# Patient Record
Sex: Male | Born: 1965 | Race: White | Hispanic: No | Marital: Married | Smoking: Never smoker
Health system: Southern US, Community
[De-identification: ages and names within clinical notes are randomized; demographics above are authoritative.]

## PROBLEM LIST (undated history)

## (undated) DIAGNOSIS — I10 Essential (primary) hypertension: Secondary | ICD-10-CM

## (undated) DIAGNOSIS — E7800 Pure hypercholesterolemia, unspecified: Secondary | ICD-10-CM

## (undated) DIAGNOSIS — R51 Headache: Secondary | ICD-10-CM

## (undated) DIAGNOSIS — F419 Anxiety disorder, unspecified: Secondary | ICD-10-CM

## (undated) DIAGNOSIS — R072 Precordial pain: Secondary | ICD-10-CM

## (undated) DIAGNOSIS — E349 Endocrine disorder, unspecified: Secondary | ICD-10-CM

## (undated) DIAGNOSIS — G4730 Sleep apnea, unspecified: Secondary | ICD-10-CM

## (undated) DIAGNOSIS — K219 Gastro-esophageal reflux disease without esophagitis: Secondary | ICD-10-CM

## (undated) DIAGNOSIS — I2510 Atherosclerotic heart disease of native coronary artery without angina pectoris: Secondary | ICD-10-CM

## (undated) DIAGNOSIS — F329 Major depressive disorder, single episode, unspecified: Secondary | ICD-10-CM

## (undated) HISTORY — PX: UPPER GASTROINTESTINAL ENDOSCOPY: SHX188

## (undated) HISTORY — DX: Endocrine disorder, unspecified: E34.9

## (undated) HISTORY — DX: Essential (primary) hypertension: I10

## (undated) HISTORY — DX: Gastro-esophageal reflux disease without esophagitis: K21.9

## (undated) HISTORY — DX: Precordial pain: R07.2

## (undated) HISTORY — DX: Anxiety disorder, unspecified: F41.9

## (undated) HISTORY — PX: CARDIAC CATHETERIZATION: SHX172

## (undated) HISTORY — DX: Pure hypercholesterolemia, unspecified: E78.00

## (undated) HISTORY — DX: Major depressive disorder, single episode, unspecified: F32.9

## (undated) HISTORY — DX: Sleep apnea, unspecified: G47.30

## (undated) HISTORY — DX: Headache: R51

---

## 2002-07-24 ENCOUNTER — Inpatient Hospital Stay (HOSPITAL_COMMUNITY): Admission: EM | Admit: 2002-07-24 | Discharge: 2002-07-28 | Payer: Self-pay | Admitting: Psychiatry

## 2016-11-23 HISTORY — PX: COLONOSCOPY: SHX174

## 2018-03-18 ENCOUNTER — Ambulatory Visit (INDEPENDENT_AMBULATORY_CARE_PROVIDER_SITE_OTHER): Payer: PRIVATE HEALTH INSURANCE | Admitting: Psychiatry

## 2018-03-18 DIAGNOSIS — F411 Generalized anxiety disorder: Secondary | ICD-10-CM | POA: Diagnosis not present

## 2018-03-18 MED ORDER — VORTIOXETINE HBR 5 MG PO TABS: 20.0000 mg | ORAL_TABLET | Freq: Every day | ORAL | 1 refills | Status: DC

## 2018-03-18 MED ORDER — ALPRAZOLAM 0.25 MG PO TABS: 0.5000 mg | ORAL_TABLET | Freq: Every day | ORAL | 0 refills | Status: DC | PRN

## 2018-03-18 MED ORDER — GABAPENTIN 100 MG PO CAPS: 100.0000 mg | ORAL_CAPSULE | Freq: Three times a day (TID) | ORAL | 1 refills | Status: DC

## 2018-03-18 NOTE — Progress Notes (Signed)
[facility]  Patient ID: Aaron Reese,  MRN: [id number]  PCP: Angelina Sheriff, MD  Date of Evaluation: 03/18/2018 Time spent:20 minutes   HISTORY/CURRENT STATUS: HPI patient is a 52 year old white male seen last 02/18/2018 diagnosis generalized anxiety disorder with panic attacks at the last visit panic attacks were slightly better therefore increased his BuSpar and continues Trintellix.  Here with wife Aaron Reese Currently patient feels depression is not bad.  Patient is anxious.  Patient is inpatient.  Individual Medical History/ Review of Systems: Changes? :No  Allergies: Patient has no known allergies.  Current Medications:  Current Outpatient Medications:  .  ALPRAZolam (XANAX) 0.25 MG tablet, Take 0.25 mg by mouth 2 (two) times daily., Disp: , Rfl:  .  busPIRone (BUSPAR) 15 MG tablet, Take 15 mg by mouth 2 (two) times daily. 1/2 BID X 1 WK, THEN BID, Disp: , Rfl:  .  vortioxetine HBr (TRINTELLIX) 20 MG TABS tablet, Take 20 mg by mouth daily., Disp: , Rfl:  Medication Side Effects: None  Family Medical/ Social History: Changes? No  MENTAL HEALTH EXAM:  There were no vitals taken for this visit.There is no height or weight on file to calculate BMI.  General Appearance: Casual  Eye Contact:  Good  Speech:  Clear and Coherent  Volume:  Normal  Mood:  Anxious  Affect:  Appropriate  Thought Process:  Coherent  Orientation:  Full (Time, Place, and Person)  Thought Content: Logical   Suicidal Thoughts:  No  Homicidal Thoughts:  No  Memory:  Immediate  Judgement:  Good  Insight:  Good  Psychomotor Activity:  Normal  Concentration:  Concentration: Good  Recall:  Good  Fund of Knowledge: Good  Language: Good  Akathisia:  NA  AIMS (if indicated): na   Assets:  Social Support  ADL's:  Intact  Cognition: WNL  Prognosis:  Good    DIAGNOSES: No diagnosis found.  RECOMMENDATIONS: Gabapentin-order PSG 100 mg 1 3 times a day. Consider a snap  sleep Consider PSG.   Comer Locket, PA-C

## 2018-03-19 ENCOUNTER — Telehealth: Payer: Self-pay | Admitting: Psychiatry

## 2018-03-19 NOTE — Telephone Encounter (Signed)
[facility] need clarification on med Trintellix. [phone]

## 2018-03-19 NOTE — Telephone Encounter (Signed)
Clarified with [facility] patient is taking Trintellix 20mg / daily

## 2018-03-24 DIAGNOSIS — F32A Depression, unspecified: Secondary | ICD-10-CM

## 2018-03-24 DIAGNOSIS — F329 Major depressive disorder, single episode, unspecified: Secondary | ICD-10-CM | POA: Insufficient documentation

## 2018-03-24 DIAGNOSIS — I10 Essential (primary) hypertension: Secondary | ICD-10-CM

## 2018-03-24 DIAGNOSIS — R51 Headache: Secondary | ICD-10-CM

## 2018-03-24 DIAGNOSIS — E349 Endocrine disorder, unspecified: Secondary | ICD-10-CM

## 2018-03-24 DIAGNOSIS — R079 Chest pain, unspecified: Secondary | ICD-10-CM

## 2018-03-24 DIAGNOSIS — R519 Headache, unspecified: Secondary | ICD-10-CM

## 2018-03-24 DIAGNOSIS — K219 Gastro-esophageal reflux disease without esophagitis: Secondary | ICD-10-CM

## 2018-03-24 DIAGNOSIS — E7800 Pure hypercholesterolemia, unspecified: Secondary | ICD-10-CM

## 2018-03-24 DIAGNOSIS — F419 Anxiety disorder, unspecified: Secondary | ICD-10-CM

## 2018-03-24 DIAGNOSIS — R072 Precordial pain: Secondary | ICD-10-CM

## 2018-03-24 HISTORY — DX: Precordial pain: R07.2

## 2018-03-24 HISTORY — DX: Headache, unspecified: R51.9

## 2018-03-24 HISTORY — DX: Gastro-esophageal reflux disease without esophagitis: K21.9

## 2018-03-24 HISTORY — DX: Essential (primary) hypertension: I10

## 2018-03-24 HISTORY — DX: Endocrine disorder, unspecified: E34.9

## 2018-03-24 HISTORY — DX: Depression, unspecified: F32.A

## 2018-03-24 HISTORY — DX: Anxiety disorder, unspecified: F41.9

## 2018-03-24 HISTORY — DX: Pure hypercholesterolemia, unspecified: E78.00

## 2018-03-24 HISTORY — DX: Chest pain, unspecified: R07.9

## 2018-03-31 ENCOUNTER — Encounter: Payer: Self-pay | Admitting: *Deleted

## 2018-03-31 ENCOUNTER — Telehealth: Payer: Self-pay | Admitting: *Deleted

## 2018-03-31 ENCOUNTER — Encounter: Payer: Self-pay | Admitting: Cardiology

## 2018-03-31 ENCOUNTER — Ambulatory Visit (INDEPENDENT_AMBULATORY_CARE_PROVIDER_SITE_OTHER): Payer: PRIVATE HEALTH INSURANCE | Admitting: Cardiology

## 2018-03-31 ENCOUNTER — Ambulatory Visit (HOSPITAL_BASED_OUTPATIENT_CLINIC_OR_DEPARTMENT_OTHER)
Admission: RE | Admit: 2018-03-31 | Discharge: 2018-03-31 | Disposition: A | Discharge status: Home / Self Care | Payer: PRIVATE HEALTH INSURANCE | Source: Ambulatory Visit | Attending: Cardiology | Admitting: Cardiology

## 2018-03-31 VITALS — BP 130/96 | HR 101 | Ht 72.0 in | Wt 239.8 lb

## 2018-03-31 DIAGNOSIS — J9811 Atelectasis: Secondary | ICD-10-CM

## 2018-03-31 DIAGNOSIS — I10 Essential (primary) hypertension: Secondary | ICD-10-CM

## 2018-03-31 DIAGNOSIS — E7800 Pure hypercholesterolemia, unspecified: Secondary | ICD-10-CM

## 2018-03-31 DIAGNOSIS — R079 Chest pain, unspecified: Secondary | ICD-10-CM

## 2018-03-31 DIAGNOSIS — R918 Other nonspecific abnormal finding of lung field: Secondary | ICD-10-CM

## 2018-03-31 DIAGNOSIS — I2510 Atherosclerotic heart disease of native coronary artery without angina pectoris: Secondary | ICD-10-CM

## 2018-03-31 HISTORY — DX: Other nonspecific abnormal finding of lung field: R91.8

## 2018-03-31 LAB — BASIC METABOLIC PANEL
BUN/Creatinine Ratio: 10 (ref 9–20)
BUN: 12 mg/dL (ref 6–24)
CALCIUM: 9.6 mg/dL (ref 8.7–10.2)
CHLORIDE: 100 mmol/L (ref 96–106)
CO2: 25 mmol/L (ref 20–29)
Creatinine, Ser: 1.2 mg/dL (ref 0.76–1.27)
GFR calc non Af Amer: 70 mL/min/{1.73_m2} (ref >59)
GFR, EST AFRICAN AMERICAN: 81 mL/min/{1.73_m2} (ref >59)
GLUCOSE: 93 mg/dL (ref 65–99)
POTASSIUM: 4.6 mmol/L (ref 3.5–5.2)
SODIUM: 140 mmol/L (ref 134–144)

## 2018-03-31 LAB — CBC
Hematocrit: 51 % (ref 37.5–51.0)
Hemoglobin: 17.9 g/dL — ABNORMAL HIGH (ref 13.0–17.7)
MCH: 30.9 pg (ref 26.6–33.0)
MCHC: 35.1 g/dL (ref 31.5–35.7)
MCV: 88 fL (ref 79–97)
PLATELETS: 229 10*3/uL (ref 150–450)
RBC: 5.79 x10E6/uL (ref 4.14–5.80)
RDW: 13.8 % (ref 12.3–15.4)
WBC: 12.8 10*3/uL — AB (ref 3.4–10.8)

## 2018-03-31 LAB — TROPONIN I: Troponin I: <0.01 ng/mL (ref 0.00–0.04)

## 2018-03-31 MED ORDER — ASPIRIN EC 81 MG PO TBEC: 81.0000 mg | DELAYED_RELEASE_TABLET | Freq: Every day | ORAL | 3 refills | Status: DC

## 2018-03-31 MED ORDER — METOPROLOL SUCCINATE ER 50 MG PO TB24: 50.0000 mg | ORAL_TABLET | Freq: Every day | ORAL | 3 refills | Status: DC

## 2018-03-31 NOTE — H&P (View-Only) (Signed)
Cardiology Office Note:    Date:  03/31/2018   ID:  Aaron Reese, DOB Jun 18, 1966, MRN [id number]  PCP:  Angelina Sheriff, MD  Cardiologist:  Shirlee More, MD   Referring MD: No ref. provider found  ASSESSMENT:    1. Chest pain in adult   2. Benign essential hypertension   3. Hypercholesterolemia   4. Mild CAD    PLAN:    In order of problems listed above:  1. He underwent coronary angiography in 2010 and had mild nonobstructive CAD.  He has developed a pattern of anginal discomfort presently [place] angina classification 3 he has had episodes awakening from sleep at night.  For further evaluation I asked him to undergo coronary angiography we will check a troponin if positive he need to be admitted to treat his acute coronary syndrome in the interim we will start aspirin and beta-blocker and continue his statin.  Risk benefits and options detailed with the patient and his wife for informed consent 2. Stable continue current treatment 3. Stable continue with statin 4. Clinically my suspicion is developed severe obstructive CAD over the last decade is very high I think there is no utility for noninvasive testing at this time we will check troponin exclude acute coronary syndrome referral for coronary angiography  Next appointment 2 weeks   Medication Adjustments/Labs and Tests Ordered: Current medicines are reviewed at length with the patient today.  Concerns regarding medicines are outlined above.  Orders Placed This Encounter  Procedures  . DG Chest 2 View  . CBC  . Basic Metabolic Panel (BMET)  . Troponin I  . EKG 12-Lead   Meds ordered this encounter  Medications  . metoprolol succinate (TOPROL XL) 50 MG 24 hr tablet    Sig: Take 1 tablet (50 mg total) by mouth daily. Take with or immediately following a meal.    Dispense:  30 tablet    Refill:  3  . aspirin EC 81 MG tablet    Sig: Take 1 tablet (81 mg total) by mouth daily.    Dispense:  90 tablet    Refill:  3      Chief Complaint  Patient presents with  . Chest Pain    History of Present Illness:    Aaron Reese is a 52 y.o. male who is being seen today for the evaluation of chest pain at the request of Dr. Reading He was seen by me in 2010 as an inpatient [facility] referred for coronary angiography for chest pain and have mild nonobstructive CAD.  For the last several months he has been having chest pain initially was quite vague nonexertional atypical in nature but his symptoms have progressed since he had pneumonia 10 weeks ago and now complains of very typical angina pectoris.  He describes substernal tightness and burning which is moderate in severity worsened with activity and improved with rest but now is happening at rest and has awakened him from his sleep at night and has increased in frequency.  He had an episode walking in from the car to the office today.  His previous EKGs and EKG today remains normal.  Reviewed the option of referral for noninvasive testing or cardiac CTA but with known CAD and rapid progression of symptoms [place] angina classification 3 I advised he undergo coronary angiography and initiate medical therapy with aspirin and beta-blocker.  His chest pain does not radiate is not pleuritic no associated nausea not worsened with swallowing no associated diaphoresis  Past Medical History:  Diagnosis Date  . Anxiety 03/24/2018  . Benign essential hypertension 03/24/2018  . Depression 03/24/2018  . Frequent headaches 03/24/2018  . GERD (gastroesophageal reflux disease) 03/24/2018  . Hypercholesterolemia 03/24/2018  . Hypotestosteronemia 03/24/2018  . Substernal chest pain 03/24/2018    Past Surgical History:  Procedure Laterality Date  . CARDIAC CATHETERIZATION      Current Medications: Current Meds  Medication Sig  . ALPRAZolam (XANAX) 0.25 MG tablet Take 2 tablets (0.5 mg total) by mouth daily as needed for anxiety.  Marland Kitchen levocetirizine (XYZAL) 5 MG tablet  Take 1 tablet (5 mg) by mouth daily in the evening  . lisinopril (PRINIVIL,ZESTRIL) 10 MG tablet Take 10 mg by mouth daily.  . pantoprazole (PROTONIX) 40 MG tablet Take 40 mg by mouth daily.  . simvastatin (ZOCOR) 40 MG tablet Take 40 mg by mouth daily.  Marland Kitchen vortioxetine HBr (TRINTELLIX) 20 MG TABS tablet Take 20 mg by mouth daily.     Allergies:   Citalopram; Sertraline; and Venlafaxine   Social History   Socioeconomic History  . Marital status: Single    Spouse name: Not on file  . Number of children: Not on file  . Years of education: Not on file  . Highest education level: Not on file  Occupational History  . Not on file  Social Needs  . Financial resource strain: Not on file  . Food insecurity:    Worry: Not on file    Inability: Not on file  . Transportation needs:    Medical: Not on file    Non-medical: Not on file  Tobacco Use  . Smoking status: Never Smoker  . Smokeless tobacco: Never Used  Substance and Sexual Activity  . Alcohol use: Not Currently    Frequency: Never  . Drug use: Not Currently  . Sexual activity: Not on file  Lifestyle  . Physical activity:    Days per week: Not on file    Minutes per session: Not on file  . Stress: Not on file  Relationships  . Social connections:    Talks on phone: Not on file    Gets together: Not on file    Attends religious service: Not on file    Active member of club or organization: Not on file    Attends meetings of clubs or organizations: Not on file    Relationship status: Not on file  Other Topics Concern  . Not on file  Social History Narrative  . Not on file     Family History: The patient's family history includes ALS in his father; Hypertension in his mother; Stroke in his brother. There is no history of Heart disease, Cancer, or Diabetes.  ROS:   Review of Systems  Constitution: Positive for chills and malaise/fatigue.  HENT: Negative.   Eyes: Negative.   Cardiovascular: Positive for chest pain  and dyspnea on exertion.  Respiratory: Positive for cough and shortness of breath (with exertion).   Endocrine: Negative.   Hematologic/Lymphatic: Negative.   Skin: Negative.   Musculoskeletal: Negative.   Gastrointestinal: Positive for heartburn.  Genitourinary: Negative.   Neurological: Negative.   Psychiatric/Behavioral: Positive for depression. The patient is nervous/anxious.   Allergic/Immunologic: Negative.    Please see the history of present illness.     All other systems reviewed and are negative.  EKGs/Labs/Other Studies Reviewed:    The following studies were reviewed today:   EKG:  EKG is  ordered today.  The ekg ordered today  demonstrates sinus rhythm and normal  Recent Labs: No results found for requested labs within last 8760 hours.  Recent Lipid Panel No results found for: CHOL, TRIG, HDL, CHOLHDL, VLDL, LDLCALC, LDLDIRECT  Physical Exam:    VS:  BP (!) 130/96 (BP Location: Left Arm, Patient Position: Sitting, Cuff Size: Large)   Pulse (!) 101   Ht 6' (1.829 m)   Wt 239 lb 12.8 oz (108.8 kg)   SpO2 96%   BMI 32.52 kg/m     Wt Readings from Last 3 Encounters:  03/31/18 239 lb 12.8 oz (108.8 kg)     GEN:  Well nourished, well developed in no acute distress HEENT: Normal NECK: No JVD; No carotid bruits LYMPHATICS: No lymphadenopathy CARDIAC: RRR, no murmurs, rubs, gallops RESPIRATORY:  Clear to auscultation without rales, wheezing or rhonchi  ABDOMEN: Soft, non-tender, non-distended MUSCULOSKELETAL:  No edema; No deformity  SKIN: Warm and dry NEUROLOGIC:  Alert and oriented x 3 PSYCHIATRIC:  Normal affect     Signed, Shirlee More, MD  03/31/2018 12:09 PM    [place]

## 2018-03-31 NOTE — Telephone Encounter (Signed)
-----   Message from Richardo Priest, MD sent at 03/31/2018  3:48 PM EDT ----- Normal or stable result  Non contrast CT chest

## 2018-03-31 NOTE — Addendum Note (Signed)
Addended by: Austin Miles on: 03/31/2018 04:21 PM   Modules accepted: Orders

## 2018-03-31 NOTE — Progress Notes (Signed)
Cardiology Office Note:    Date:  03/31/2018   ID:  Aaron Reese, DOB [birth year], MRN [id number]  PCP:  Angelina Sheriff, MD  Cardiologist:  Shirlee More, MD   Referring MD: No ref. provider found  ASSESSMENT:    1. Chest pain in adult   2. Benign essential hypertension   3. Hypercholesterolemia   4. Mild CAD    PLAN:    In order of problems listed above:  1. He underwent coronary angiography in 2010 and had mild nonobstructive CAD.  He has developed a pattern of anginal discomfort presently [place] angina classification 3 he has had episodes awakening from sleep at night.  For further evaluation I asked him to undergo coronary angiography we will check a troponin if positive he need to be admitted to treat his acute coronary syndrome in the interim we will start aspirin and beta-blocker and continue his statin.  Risk benefits and options detailed with the patient and his wife for informed consent 2. Stable continue current treatment 3. Stable continue with statin 4. Clinically my suspicion is developed severe obstructive CAD over the last decade is very high I think there is no utility for noninvasive testing at this time we will check troponin exclude acute coronary syndrome referral for coronary angiography  Next appointment 2 weeks   Medication Adjustments/Labs and Tests Ordered: Current medicines are reviewed at length with the patient today.  Concerns regarding medicines are outlined above.  Orders Placed This Encounter  Procedures  . DG Chest 2 View  . CBC  . Basic Metabolic Panel (BMET)  . Troponin I  . EKG 12-Lead   Meds ordered this encounter  Medications  . metoprolol succinate (TOPROL XL) 50 MG 24 hr tablet    Sig: Take 1 tablet (50 mg total) by mouth daily. Take with or immediately following a meal.    Dispense:  30 tablet    Refill:  3  . aspirin EC 81 MG tablet    Sig: Take 1 tablet (81 mg total) by mouth daily.    Dispense:  90 tablet    Refill:  3      Chief Complaint  Patient presents with  . Chest Pain    History of Present Illness:    Aaron Reese is a 52 y.o. male who is being seen today for the evaluation of chest pain at the request of Dr. Reading He was seen by me in 2010 as an inpatient [facility] referred for coronary angiography for chest pain and have mild nonobstructive CAD.  For the last several months he has been having chest pain initially was quite vague nonexertional atypical in nature but his symptoms have progressed since he had pneumonia 10 weeks ago and now complains of very typical angina pectoris.  He describes substernal tightness and burning which is moderate in severity worsened with activity and improved with rest but now is happening at rest and has awakened him from his sleep at night and has increased in frequency.  He had an episode walking in from the car to the office today.  His previous EKGs and EKG today remains normal.  Reviewed the option of referral for noninvasive testing or cardiac CTA but with known CAD and rapid progression of symptoms [place] angina classification 3 I advised he undergo coronary angiography and initiate medical therapy with aspirin and beta-blocker.  His chest pain does not radiate is not pleuritic no associated nausea not worsened with swallowing no associated diaphoresis  Past Medical History:  Diagnosis Date  . Anxiety 03/24/2018  . Benign essential hypertension 03/24/2018  . Depression 03/24/2018  . Frequent headaches 03/24/2018  . GERD (gastroesophageal reflux disease) 03/24/2018  . Hypercholesterolemia 03/24/2018  . Hypotestosteronemia 03/24/2018  . Substernal chest pain 03/24/2018    Past Surgical History:  Procedure Laterality Date  . CARDIAC CATHETERIZATION      Current Medications: Current Meds  Medication Sig  . ALPRAZolam (XANAX) 0.25 MG tablet Take 2 tablets (0.5 mg total) by mouth daily as needed for anxiety.  Marland Kitchen levocetirizine (XYZAL) 5 MG tablet  Take 1 tablet (5 mg) by mouth daily in the evening  . lisinopril (PRINIVIL,ZESTRIL) 10 MG tablet Take 10 mg by mouth daily.  . pantoprazole (PROTONIX) 40 MG tablet Take 40 mg by mouth daily.  . simvastatin (ZOCOR) 40 MG tablet Take 40 mg by mouth daily.  Marland Kitchen vortioxetine HBr (TRINTELLIX) 20 MG TABS tablet Take 20 mg by mouth daily.     Allergies:   Citalopram; Sertraline; and Venlafaxine   Social History   Socioeconomic History  . Marital status: Single    Spouse name: Not on file  . Number of children: Not on file  . Years of education: Not on file  . Highest education level: Not on file  Occupational History  . Not on file  Social Needs  . Financial resource strain: Not on file  . Food insecurity:    Worry: Not on file    Inability: Not on file  . Transportation needs:    Medical: Not on file    Non-medical: Not on file  Tobacco Use  . Smoking status: Never Smoker  . Smokeless tobacco: Never Used  Substance and Sexual Activity  . Alcohol use: Not Currently    Frequency: Never  . Drug use: Not Currently  . Sexual activity: Not on file  Lifestyle  . Physical activity:    Days per week: Not on file    Minutes per session: Not on file  . Stress: Not on file  Relationships  . Social connections:    Talks on phone: Not on file    Gets together: Not on file    Attends religious service: Not on file    Active member of club or organization: Not on file    Attends meetings of clubs or organizations: Not on file    Relationship status: Not on file  Other Topics Concern  . Not on file  Social History Narrative  . Not on file     Family History: The patient's family history includes ALS in his father; Hypertension in his mother; Stroke in his brother. There is no history of Heart disease, Cancer, or Diabetes.  ROS:   Review of Systems  Constitution: Positive for chills and malaise/fatigue.  HENT: Negative.   Eyes: Negative.   Cardiovascular: Positive for chest pain  and dyspnea on exertion.  Respiratory: Positive for cough and shortness of breath (with exertion).   Endocrine: Negative.   Hematologic/Lymphatic: Negative.   Skin: Negative.   Musculoskeletal: Negative.   Gastrointestinal: Positive for heartburn.  Genitourinary: Negative.   Neurological: Negative.   Psychiatric/Behavioral: Positive for depression. The patient is nervous/anxious.   Allergic/Immunologic: Negative.    Please see the history of present illness.     All other systems reviewed and are negative.  EKGs/Labs/Other Studies Reviewed:    The following studies were reviewed today:   EKG:  EKG is  ordered today.  The ekg ordered today  demonstrates sinus rhythm and normal  Recent Labs: No results found for requested labs within last 8760 hours.  Recent Lipid Panel No results found for: CHOL, TRIG, HDL, CHOLHDL, VLDL, LDLCALC, LDLDIRECT  Physical Exam:    VS:  BP (!) 130/96 (BP Location: Left Arm, Patient Position: Sitting, Cuff Size: Large)   Pulse (!) 101   Ht 6' (1.829 m)   Wt 239 lb 12.8 oz (108.8 kg)   SpO2 96%   BMI 32.52 kg/m     Wt Readings from Last 3 Encounters:  03/31/18 239 lb 12.8 oz (108.8 kg)     GEN:  Well nourished, well developed in no acute distress HEENT: Normal NECK: No JVD; No carotid bruits LYMPHATICS: No lymphadenopathy CARDIAC: RRR, no murmurs, rubs, gallops RESPIRATORY:  Clear to auscultation without rales, wheezing or rhonchi  ABDOMEN: Soft, non-tender, non-distended MUSCULOSKELETAL:  No edema; No deformity  SKIN: Warm and dry NEUROLOGIC:  Alert and oriented x 3 PSYCHIATRIC:  Normal affect     Signed, Shirlee More, MD  03/31/2018 12:09 PM    [place]

## 2018-03-31 NOTE — Telephone Encounter (Signed)
Patient informed of chest x-ray and lab results from this morning and advised that Dr. Bettina Gavia recommends a chest CT without contrast. Patient verbalized understanding and is agreeable. Advised patient Aaron Reese from our imaging department would contact him to schedule for tomorrow. Precert sent. No further questions.

## 2018-03-31 NOTE — Patient Instructions (Addendum)
Medication Instructions:  Your physician has recommended you make the following change in your medication:   START aspirin 81 mg daily START metoprolol succinate (toprol XL) 50 mg daily   If you need a refill on your cardiac medications before your next appointment, please call your pharmacy.   Lab work: Your physician recommends that you return for lab work today: STAT troponin, CBC, BMP.   If you have labs (blood work) drawn today and your tests are completely normal, you will receive your results only by: Marland Kitchen MyChart Message (if you have MyChart) OR . A paper copy in the mail If you have any lab test that is abnormal or we need to change your treatment, we will call you to review the results.  Testing/Procedures: You had an EKG today.   A chest x-ray takes a picture of the organs and structures inside the chest, including the heart, lungs, and blood vessels. This test can show several things, including, whether the heart is enlarges; whether fluid is building up in the lungs; and whether pacemaker / defibrillator leads are still in place.  [facility] [address], [place] [address] Dept: [phone] Loc: [phone]  [id number]  03/31/2018  You are scheduled for a Cardiac Catheterization on Wednesday, October 16 with Dr. Shelva Majestic.  1. Please arrive at the [facility] (Main Entrance A) at [facility]: [address] [place], [place] [address] at 9:30 AM (This time is two hours before your procedure to ensure your preparation). Free valet parking service is available.   Special note: Every effort is made to have your procedure done on time. Please understand that emergencies sometimes delay scheduled procedures.  2. Diet: Do not eat solid foods after midnight.  The patient may have clear liquids until 5am upon the day of the procedure.  3. Labs: None  needed.  4. Medication instructions in preparation for your procedure:   Contrast Allergy: No   Stop taking, Lisinopril (Zestril or Prinivil) on Tuesday, October 15.   On the morning of your procedure, take your Aspirin and any morning medicines NOT listed above.  You may use sips of water.  5. Plan for one night stay--bring personal belongings. 6. Bring a current list of your medications and current insurance cards. 7. You MUST have a responsible person to drive you home. 8. Someone MUST be with you the first 24 hours after you arrive home or your discharge will be delayed. 9. Please wear clothes that are easy to get on and off and wear slip-on shoes.  Thank you for allowing [place] to care for you!   -- [facility]   Follow-Up: [facility] you and your health needs are our priority.  As part of our continuing mission to provide you with exceptional heart care, we have created designated Provider Care Teams.  These Care Teams include your primary Cardiologist (physician) and Advanced Practice Providers (APPs -  Physician Assistants and Nurse Practitioners) who all work together to provide you with the care you need, when you need it. . You will need a follow up appointment in 2 weeks.       Aspirin and Your Heart Aspirin is a medicine that affects the way blood clots. Aspirin can be used to help reduce the risk of blood clots, heart attacks, and other heart-related problems. Should I take aspirin? Your health  care provider will help you determine whether it is safe and beneficial for you to take aspirin daily. Taking aspirin daily may be beneficial if you:  Have had a heart attack or chest pain.  Have undergone open heart surgery such as coronary artery bypass surgery (CABG).  Have had coronary angioplasty.  Have experienced a stroke or transient ischemic attack (TIA).  Have peripheral vascular disease (PVD).  Have chronic heart rhythm  problems such as atrial fibrillation.  Are there any risks of taking aspirin daily? Daily use of aspirin can increase your risk of side effects. Some of these include:  Bleeding. Bleeding problems can be minor or serious. An example of a minor problem is a cut that does not stop bleeding. An example of a more serious problem is stomach bleeding or bleeding into the brain. Your risk of bleeding is increased if you are also taking non-steroidal anti-inflammatory medicine (NSAIDs).  Increased bruising.  Upset stomach.  An allergic reaction. People who have nasal polyps have an increased risk of developing an aspirin allergy.  What are some guidelines I should follow when taking aspirin?  Take aspirin only as directed by your health care provider. Make sure you understand how much you should take and what form you should take. The two forms of aspirin are: ? Non-enteric-coated. This type of aspirin does not have a coating and is absorbed quickly. Non-enteric-coated aspirin is usually recommended for people with chest pain. This type of aspirin also comes in a chewable form. ? Enteric-coated. This type of aspirin has a special coating that releases the medicine very slowly. Enteric-coated aspirin causes less stomach upset than non-enteric-coated aspirin. This type of aspirin should not be chewed or crushed.  Drink alcohol in moderation. Drinking alcohol increases your risk of bleeding. When should I seek medical care?  You have unusual bleeding or bruising.  You have stomach pain.  You have an allergic reaction. Symptoms of an allergic reaction include: ? Hives. ? Itchy skin. ? Swelling of the lips, tongue, or face.  You have ringing in your ears. When should I seek immediate medical care?  Your bowel movements are bloody, dark red, or black in color.  You vomit or cough up blood.  You have blood in your urine.  You cough, wheeze, or feel short of breath. If you have any of the  following symptoms, this is an emergency. Do not wait to see if the pain will go away. Get medical help at once. Call your local emergency services (911 in the [place] Do not drive yourself to the hospital.  You have severe chest pain, especially if the pain is crushing or pressure-like and spreads to the arms, back, neck, or jaw.  You have stroke-like symptoms, such as: ? Loss of vision. ? Difficulty talking. ? Numbness or weakness on one side of your body. ? Numbness or weakness in your arm or leg. ? Not thinking clearly or feeling confused.  This information is not intended to replace advice given to you by your health care provider. Make sure you discuss any questions you have with your health care provider. Document Released: 05/17/2008 Document Revised: 10/12/2015 Document Reviewed: 09/09/2013 Elsevier Interactive Patient Education  2018 [place].   Metoprolol extended-release tablets What is this medicine? METOPROLOL (me TOE proe lole) is a beta-blocker. Beta-blockers reduce the workload on the heart and help it to beat more regularly. This medicine is used to treat high blood pressure and to prevent chest pain. It is  also used to after a heart attack and to prevent an additional heart attack from occurring. This medicine may be used for other purposes; ask your health care provider or pharmacist if you have questions. COMMON BRAND NAME(S): toprol, Toprol XL What should I tell my health care provider before I take this medicine? They need to know if you have any of these conditions: -diabetes -heart or vessel disease like slow heart rate, worsening heart failure, heart block, sick sinus syndrome or Raynaud's disease -kidney disease -liver disease -lung or breathing disease, like asthma or emphysema -pheochromocytoma -thyroid disease -an unusual or allergic reaction to metoprolol, other beta-blockers, medicines, foods, dyes, or preservatives -pregnant or trying to get  pregnant -breast-feeding How should I use this medicine? Take this medicine by mouth with a glass of water. Follow the directions on the prescription label. Do not crush or chew. Take this medicine with or immediately after meals. Take your doses at regular intervals. Do not take more medicine than directed. Do not stop taking this medicine suddenly. This could lead to serious heart-related effects. Talk to your pediatrician regarding the use of this medicine in children. While this drug may be prescribed for children as young as 6 years for selected conditions, precautions do apply. Overdosage: If you think you have taken too much of this medicine contact a poison control center or emergency room at once. NOTE: This medicine is only for you. Do not share this medicine with others. What if I miss a dose? If you miss a dose, take it as soon as you can. If it is almost time for your next dose, take only that dose. Do not take double or extra doses. What may interact with this medicine? This medicine may interact with the following medications: -certain medicines for blood pressure, heart disease, irregular heart beat -certain medicines for depression, like monoamine oxidase (MAO) inhibitors, fluoxetine, or paroxetine -clonidine -dobutamine -epinephrine -isoproterenol -reserpine This list may not describe all possible interactions. Give your health care provider a list of all the medicines, herbs, non-prescription drugs, or dietary supplements you use. Also tell them if you smoke, drink alcohol, or use illegal drugs. Some items may interact with your medicine. What should I watch for while using this medicine? Visit your doctor or health care professional for regular check ups. Contact your doctor right away if your symptoms worsen. Check your blood pressure and pulse rate regularly. Ask your health care professional what your blood pressure and pulse rate should be, and when you should contact  them. You may get drowsy or dizzy. Do not drive, use machinery, or do anything that needs mental alertness until you know how this medicine affects you. Do not sit or stand up quickly, especially if you are an older patient. This reduces the risk of dizzy or fainting spells. Contact your doctor if these symptoms continue. Alcohol may interfere with the effect of this medicine. Avoid alcoholic drinks. What side effects may I notice from receiving this medicine? Side effects that you should report to your doctor or health care professional as soon as possible: -allergic reactions like skin rash, itching or hives -cold or numb hands or feet -depression -difficulty breathing -faint -fever with sore throat -irregular heartbeat, chest pain -rapid weight gain -swollen legs or ankles Side effects that usually do not require medical attention (report to your doctor or health care professional if they continue or are bothersome): -anxiety or nervousness -change in sex drive or performance -dry skin -headache -nightmares or  trouble sleeping -short term memory loss -stomach upset or diarrhea -unusually tired This list may not describe all possible side effects. Call your doctor for medical advice about side effects. You may report side effects to FDA at 1-800-FDA-1088. Where should I keep my medicine? Keep out of the reach of children. Store at room temperature between 15 and 30 degrees C (59 and 86 degrees F). Throw away any unused medicine after the expiration date. NOTE: This sheet is a summary. It may not cover all possible information. If you have questions about this medicine, talk to your doctor, pharmacist, or health care provider.  2018 Elsevier/Gold Standard (2013-02-06 14:41:37)     Coronary Angiogram With Stent Coronary angiogram with stent placement is a procedure to widen or open a narrow blood vessel of the heart (coronary artery). Arteries may become blocked by cholesterol  buildup (plaques) in the lining of the wall. When a coronary artery becomes partially blocked, blood flow to that area decreases. This may lead to chest pain or a heart attack (myocardial infarction). A stent is a small piece of metal that looks like mesh or a spring. Stent placement may be done as treatment for a heart attack or right after a coronary angiogram in which a blocked artery is found. Let your health care provider know about:  Any allergies you have.  All medicines you are taking, including vitamins, herbs, eye drops, creams, and over-the-counter medicines.  Any problems you or family members have had with anesthetic medicines.  Any blood disorders you have.  Any surgeries you have had.  Any medical conditions you have.  Whether you are pregnant or may be pregnant. What are the risks? Generally, this is a safe procedure. However, problems may occur, including:  Damage to the heart or its blood vessels.  A return of blockage.  Bleeding, infection, or bruising at the insertion site.  A collection of blood under the skin (hematoma) at the insertion site.  A blood clot in another part of the body.  Kidney injury.  Allergic reaction to the dye or contrast that is used.  Bleeding into the abdomen (retroperitoneal bleeding).  What happens before the procedure? Staying hydrated Follow instructions from your health care provider about hydration, which may include:  Up to 2 hours before the procedure - you may continue to drink clear liquids, such as water, clear fruit juice, black coffee, and plain tea.  Eating and drinking restrictions Follow instructions from your health care provider about eating and drinking, which may include:  8 hours before the procedure - stop eating heavy meals or foods such as meat, fried foods, or fatty foods.  6 hours before the procedure - stop eating light meals or foods, such as toast or cereal.  2 hours before the procedure - stop  drinking clear liquids.  Ask your health care provider about:  Changing or stopping your regular medicines. This is especially important if you are taking diabetes medicines or blood thinners.  Taking medicines such as ibuprofen. These medicines can thin your blood. Do not take these medicines before your procedure if your health care provider instructs you not to. Generally, aspirin is recommended before a procedure of passing a small, thin tube (catheter) through a blood vessel and into the heart (cardiac catheterization).  What happens during the procedure?  An IV tube will be inserted into one of your veins.  You will be given one or more of the following: ? A medicine to help you relax (  sedative). ? A medicine to numb the area where the catheter will be inserted into an artery (local anesthetic).  To reduce your risk of infection: ? Your health care team will wash or sanitize their hands. ? Your skin will be washed with soap. ? Hair may be removed from the area where the catheter will be inserted.  Using a guide wire, the catheter will be inserted into an artery. The location may be in your groin, in your wrist, or in the fold of your arm (near your elbow).  A type of X-ray (fluoroscopy) will be used to help guide the catheter to the opening of the arteries in the heart.  A dye will be injected into the catheter, and X-rays will be taken. The dye will help to show where any narrowing or blockages are located in the arteries.  A tiny wire will be guided to the blocked spot, and a balloon will be inflated to make the artery wider.  The stent will be expanded and will crush the plaques into the wall of the vessel. The stent will hold the area open and improve the blood flow. Most stents have a drug coating to reduce the risk of the stent narrowing over time.  The artery may be made wider using a drill, laser, or other tools to remove plaques.  When the blood flow is better, the  catheter will be removed. The lining of the artery will grow over the stent, which stays where it was placed. This procedure may vary among health care providers and hospitals. What happens after the procedure?  If the procedure is done through the leg, you will be kept in bed lying flat for about 6 hours. You will be instructed to not bend and not cross your legs.  The insertion site will be checked frequently.  The pulse in your foot or wrist will be checked frequently.  You may have additional blood tests, X-rays, and a test that records the electrical activity of your heart (electrocardiogram, or ECG). This information is not intended to replace advice given to you by your health care provider. Make sure you discuss any questions you have with your health care provider. Document Released: 12/09/2002 Document Revised: 02/02/2016 Document Reviewed: 01/08/2016 Elsevier Interactive Patient Education  [employer].

## 2018-04-01 ENCOUNTER — Telehealth: Payer: Self-pay | Admitting: *Deleted

## 2018-04-01 NOTE — Telephone Encounter (Signed)
Patient called for an update regarding insurance approval for CT chest wo contrast, insurance approval still pending. CT chest wo contrast needs to be completed to rule out pneumonia before proceeding with heart catheterization per Dr. Bettina Gavia. Patient agreeable, heart cath has been cancelled until further notice. Advised patient we would contact him to schedule CT when insurance approval is received. Patient verbalized understanding. No further questions.

## 2018-04-02 ENCOUNTER — Encounter (HOSPITAL_COMMUNITY): Admission: RE | Payer: Self-pay | Source: Ambulatory Visit

## 2018-04-02 ENCOUNTER — Ambulatory Visit (HOSPITAL_COMMUNITY)
Admission: RE | Admit: 2018-04-02 | Payer: PRIVATE HEALTH INSURANCE | Source: Ambulatory Visit | Admitting: Cardiovascular Disease

## 2018-04-02 ENCOUNTER — Ambulatory Visit (HOSPITAL_BASED_OUTPATIENT_CLINIC_OR_DEPARTMENT_OTHER)
Admission: RE | Admit: 2018-04-02 | Discharge: 2018-04-02 | Disposition: A | Discharge status: Home / Self Care | Payer: PRIVATE HEALTH INSURANCE | Source: Ambulatory Visit | Attending: Cardiology | Admitting: Cardiology

## 2018-04-02 DIAGNOSIS — I700 Atherosclerosis of aorta: Secondary | ICD-10-CM | POA: Diagnosis not present

## 2018-04-02 DIAGNOSIS — J9811 Atelectasis: Secondary | ICD-10-CM | POA: Insufficient documentation

## 2018-04-02 DIAGNOSIS — I2510 Atherosclerotic heart disease of native coronary artery without angina pectoris: Secondary | ICD-10-CM | POA: Insufficient documentation

## 2018-04-02 SURGERY — LEFT HEART CATH AND CORONARY ANGIOGRAPHY
Anesthesia: LOCAL

## 2018-04-03 ENCOUNTER — Telehealth: Payer: Self-pay | Admitting: *Deleted

## 2018-04-03 DIAGNOSIS — J9811 Atelectasis: Secondary | ICD-10-CM

## 2018-04-03 NOTE — Telephone Encounter (Signed)
-----   Message from Richardo Priest, MD sent at 04/03/2018 11:39 AM EDT ----- Normal or stable result  CT shows more exetensive and worsened abnormality    Should see pulmonary in [facility]  I would proceed with heart cath first

## 2018-04-03 NOTE — Telephone Encounter (Signed)
Patient called with CT chest results and informed that Dr. Bettina Gavia recommends to proceed with heart catheterization at this time. Patient agreeable and has been rescheduled for Tuesday, 04/08/18 at 9:00 am. Patient instructed to be there at 7:00 am and reminded to hold lisinopril starting Monday, 04/07/18.   Patient agreeable to pulmonology referral which has been placed due to CT results. Informed him that someone would contact him to schedule an appointment.  Patient verbalized understanding. No further questions.

## 2018-04-05 ENCOUNTER — Encounter: Payer: Self-pay | Admitting: Emergency Medicine

## 2018-04-05 DIAGNOSIS — F411 Generalized anxiety disorder: Secondary | ICD-10-CM | POA: Insufficient documentation

## 2018-04-05 HISTORY — DX: Generalized anxiety disorder: F41.1

## 2018-04-07 ENCOUNTER — Telehealth: Payer: Self-pay | Admitting: *Deleted

## 2018-04-07 NOTE — Telephone Encounter (Signed)
Pt contacted pre-catheterization scheduled at [facility] for: Tuesday October 22,2019 9 AM Verified arrival time and place: [place] Entrance A at: 7 AM  No solid food after midnight prior to cath, clear liquids until 5 AM day of procedure. Verified allergies in Epic Verified no diabetes medications.  AM meds can be  taken pre-cath with sip of water including: ASA 81 mg   Confirmed patient has responsible person to drive home post procedure and for 24 hours after you arrive home: yes

## 2018-04-08 ENCOUNTER — Ambulatory Visit (HOSPITAL_COMMUNITY)
Admission: RE | Admit: 2018-04-08 | Discharge: 2018-04-09 | Disposition: A | Discharge status: Home / Self Care | Payer: PRIVATE HEALTH INSURANCE | Source: Ambulatory Visit | Attending: Interventional Cardiology | Admitting: Interventional Cardiology

## 2018-04-08 ENCOUNTER — Encounter (HOSPITAL_COMMUNITY)
Admission: RE | Disposition: A | Discharge status: Home / Self Care | Payer: Self-pay | Source: Ambulatory Visit | Attending: Interventional Cardiology

## 2018-04-08 ENCOUNTER — Encounter (HOSPITAL_COMMUNITY): Payer: Self-pay | Admitting: Interventional Cardiology

## 2018-04-08 ENCOUNTER — Other Ambulatory Visit: Payer: Self-pay

## 2018-04-08 DIAGNOSIS — Z823 Family history of stroke: Secondary | ICD-10-CM | POA: Insufficient documentation

## 2018-04-08 DIAGNOSIS — R079 Chest pain, unspecified: Secondary | ICD-10-CM

## 2018-04-08 DIAGNOSIS — F329 Major depressive disorder, single episode, unspecified: Secondary | ICD-10-CM | POA: Insufficient documentation

## 2018-04-08 DIAGNOSIS — E7800 Pure hypercholesterolemia, unspecified: Secondary | ICD-10-CM | POA: Diagnosis present

## 2018-04-08 DIAGNOSIS — K219 Gastro-esophageal reflux disease without esophagitis: Secondary | ICD-10-CM | POA: Diagnosis not present

## 2018-04-08 DIAGNOSIS — Z79899 Other long term (current) drug therapy: Secondary | ICD-10-CM | POA: Diagnosis not present

## 2018-04-08 DIAGNOSIS — Z955 Presence of coronary angioplasty implant and graft: Secondary | ICD-10-CM | POA: Diagnosis not present

## 2018-04-08 DIAGNOSIS — Z888 Allergy status to other drugs, medicaments and biological substances status: Secondary | ICD-10-CM | POA: Insufficient documentation

## 2018-04-08 DIAGNOSIS — Z8249 Family history of ischemic heart disease and other diseases of the circulatory system: Secondary | ICD-10-CM | POA: Insufficient documentation

## 2018-04-08 DIAGNOSIS — I25118 Atherosclerotic heart disease of native coronary artery with other forms of angina pectoris: Secondary | ICD-10-CM | POA: Diagnosis not present

## 2018-04-08 DIAGNOSIS — F419 Anxiety disorder, unspecified: Secondary | ICD-10-CM | POA: Diagnosis not present

## 2018-04-08 DIAGNOSIS — I2510 Atherosclerotic heart disease of native coronary artery without angina pectoris: Secondary | ICD-10-CM | POA: Diagnosis present

## 2018-04-08 DIAGNOSIS — I10 Essential (primary) hypertension: Secondary | ICD-10-CM

## 2018-04-08 HISTORY — PX: CORONARY STENT INTERVENTION: CATH118234

## 2018-04-08 HISTORY — DX: Atherosclerotic heart disease of native coronary artery without angina pectoris: I25.10

## 2018-04-08 HISTORY — PX: LEFT HEART CATH AND CORONARY ANGIOGRAPHY: CATH118249

## 2018-04-08 LAB — POCT ACTIVATED CLOTTING TIME: ACTIVATED CLOTTING TIME: 455 s

## 2018-04-08 SURGERY — LEFT HEART CATH AND CORONARY ANGIOGRAPHY
Anesthesia: LOCAL

## 2018-04-08 MED ORDER — BIVALIRUDIN TRIFLUOROACETATE 250 MG IV SOLR
INTRAVENOUS | Status: AC
  Filled 2018-04-08: qty 250

## 2018-04-08 MED ORDER — CLOPIDOGREL BISULFATE 300 MG PO TABS
ORAL_TABLET | ORAL | Status: AC
  Filled 2018-04-08: qty 2

## 2018-04-08 MED ORDER — ASPIRIN 81 MG PO CHEW: 81.0000 mg | CHEWABLE_TABLET | ORAL | Status: DC

## 2018-04-08 MED ORDER — SODIUM CHLORIDE 0.9% FLUSH: 3.0000 mL | INTRAVENOUS | Status: DC | PRN

## 2018-04-08 MED ORDER — ASPIRIN EC 81 MG PO TBEC
81.0000 mg | DELAYED_RELEASE_TABLET | Freq: Every day | ORAL | Status: DC
  Administered 2018-04-09: 81 mg via ORAL
  Filled 2018-04-08: qty 1

## 2018-04-08 MED ORDER — ANGIOPLASTY BOOK
Freq: Once | Status: AC
  Administered 2018-04-08: 21:00:00
  Filled 2018-04-08: qty 1

## 2018-04-08 MED ORDER — LIDOCAINE HCL (PF) 1 % IJ SOLN
INTRAMUSCULAR | Status: AC
  Filled 2018-04-08: qty 30

## 2018-04-08 MED ORDER — CLOPIDOGREL BISULFATE 75 MG PO TABS
75.0000 mg | ORAL_TABLET | Freq: Every day | ORAL | Status: DC
  Administered 2018-04-09: 75 mg via ORAL
  Filled 2018-04-08: qty 1

## 2018-04-08 MED ORDER — FAMOTIDINE 20 MG PO TABS
20.0000 mg | ORAL_TABLET | Freq: Every day | ORAL | Status: DC
  Administered 2018-04-08: 21:00:00 20 mg via ORAL
  Filled 2018-04-08: qty 1

## 2018-04-08 MED ORDER — SODIUM CHLORIDE 0.9 % IV SOLN
INTRAVENOUS | Status: AC | PRN
  Administered 2018-04-08 (×2): 1.75 mg/kg/h via INTRAVENOUS

## 2018-04-08 MED ORDER — ACETAMINOPHEN 325 MG PO TABS: 650.0000 mg | ORAL_TABLET | ORAL | Status: DC | PRN

## 2018-04-08 MED ORDER — NITROGLYCERIN 1 MG/10 ML FOR IR/CATH LAB
INTRA_ARTERIAL | Status: AC
  Filled 2018-04-08: qty 10

## 2018-04-08 MED ORDER — SODIUM CHLORIDE 0.9 % IV SOLN
INTRAVENOUS | Status: AC
  Administered 2018-04-08: 15:00:00 via INTRAVENOUS

## 2018-04-08 MED ORDER — THE SENSUOUS HEART BOOK
Freq: Once | Status: AC
  Administered 2018-04-08: 22:00:00
  Filled 2018-04-08: qty 1

## 2018-04-08 MED ORDER — HEPARIN (PORCINE) IN NACL 1000-0.9 UT/500ML-% IV SOLN
INTRAVENOUS | Status: AC
  Filled 2018-04-08: qty 1000

## 2018-04-08 MED ORDER — MIDAZOLAM HCL 2 MG/2ML IJ SOLN
INTRAMUSCULAR | Status: DC | PRN
  Administered 2018-04-08 (×2): 1 mg via INTRAVENOUS
  Administered 2018-04-08: 2 mg via INTRAVENOUS
  Administered 2018-04-08: 1 mg via INTRAVENOUS

## 2018-04-08 MED ORDER — SODIUM CHLORIDE 0.9% FLUSH
3.0000 mL | Freq: Two times a day (BID) | INTRAVENOUS | Status: DC
  Administered 2018-04-09: 3 mL via INTRAVENOUS

## 2018-04-08 MED ORDER — ALPRAZOLAM 0.5 MG PO TABS
0.5000 mg | ORAL_TABLET | Freq: Every day | ORAL | Status: DC | PRN
  Administered 2018-04-08: 21:00:00 0.5 mg via ORAL
  Filled 2018-04-08: qty 1

## 2018-04-08 MED ORDER — SIMVASTATIN 20 MG PO TABS
40.0000 mg | ORAL_TABLET | Freq: Every evening | ORAL | Status: DC
  Administered 2018-04-08: 17:00:00 40 mg via ORAL
  Filled 2018-04-08: qty 2

## 2018-04-08 MED ORDER — PANTOPRAZOLE SODIUM 40 MG PO TBEC
40.0000 mg | DELAYED_RELEASE_TABLET | Freq: Every evening | ORAL | Status: DC
  Administered 2018-04-08: 17:00:00 40 mg via ORAL
  Filled 2018-04-08: qty 1

## 2018-04-08 MED ORDER — ONDANSETRON HCL 4 MG/2ML IJ SOLN
4.0000 mg | Freq: Four times a day (QID) | INTRAMUSCULAR | Status: DC | PRN
  Administered 2018-04-08: 4 mg via INTRAVENOUS
  Filled 2018-04-08: qty 2

## 2018-04-08 MED ORDER — SODIUM CHLORIDE 0.9% FLUSH: 3.0000 mL | Freq: Two times a day (BID) | INTRAVENOUS | Status: DC

## 2018-04-08 MED ORDER — VORTIOXETINE HBR 20 MG PO TABS
20.0000 mg | ORAL_TABLET | Freq: Every evening | ORAL | Status: DC
  Administered 2018-04-08: 17:00:00 20 mg via ORAL
  Filled 2018-04-08: qty 1

## 2018-04-08 MED ORDER — VERAPAMIL HCL 2.5 MG/ML IV SOLN
INTRAVENOUS | Status: AC
  Filled 2018-04-08: qty 2

## 2018-04-08 MED ORDER — SODIUM CHLORIDE 0.9 % IV SOLN: 250.0000 mL | INTRAVENOUS | Status: DC | PRN

## 2018-04-08 MED ORDER — FENTANYL CITRATE (PF) 100 MCG/2ML IJ SOLN
INTRAMUSCULAR | Status: DC | PRN
  Administered 2018-04-08 (×4): 25 ug via INTRAVENOUS

## 2018-04-08 MED ORDER — IOHEXOL 350 MG/ML SOLN
INTRAVENOUS | Status: DC | PRN
  Administered 2018-04-08: 145 mL via INTRA_ARTERIAL

## 2018-04-08 MED ORDER — NITROGLYCERIN 1 MG/10 ML FOR IR/CATH LAB
INTRA_ARTERIAL | Status: DC | PRN
  Administered 2018-04-08: 200 ug via INTRACORONARY

## 2018-04-08 MED ORDER — HEPARIN SODIUM (PORCINE) 1000 UNIT/ML IJ SOLN
INTRAMUSCULAR | Status: AC
  Filled 2018-04-08: qty 1

## 2018-04-08 MED ORDER — BIVALIRUDIN BOLUS VIA INFUSION - CUPID
INTRAVENOUS | Status: DC | PRN
  Administered 2018-04-08: 79.95 mg via INTRAVENOUS

## 2018-04-08 MED ORDER — LISINOPRIL 10 MG PO TABS: 10.0000 mg | ORAL_TABLET | Freq: Every evening | ORAL | Status: DC

## 2018-04-08 MED ORDER — SODIUM CHLORIDE 0.9 % WEIGHT BASED INFUSION: 1.0000 mL/kg/h | INTRAVENOUS | Status: DC

## 2018-04-08 MED ORDER — SODIUM CHLORIDE 0.9 % WEIGHT BASED INFUSION
3.0000 mL/kg/h | INTRAVENOUS | Status: DC
  Administered 2018-04-08: 3 mL/kg/h via INTRAVENOUS

## 2018-04-08 MED ORDER — VERAPAMIL HCL 2.5 MG/ML IV SOLN
INTRAVENOUS | Status: DC | PRN
  Administered 2018-04-08: 10 mL via INTRA_ARTERIAL

## 2018-04-08 MED ORDER — MIDAZOLAM HCL 2 MG/2ML IJ SOLN
INTRAMUSCULAR | Status: AC
  Filled 2018-04-08: qty 2

## 2018-04-08 MED ORDER — FENTANYL CITRATE (PF) 100 MCG/2ML IJ SOLN
INTRAMUSCULAR | Status: AC
  Filled 2018-04-08: qty 2

## 2018-04-08 MED ORDER — ASPIRIN 81 MG PO CHEW: 81.0000 mg | CHEWABLE_TABLET | Freq: Every day | ORAL | Status: DC

## 2018-04-08 MED ORDER — METOPROLOL SUCCINATE ER 50 MG PO TB24
50.0000 mg | ORAL_TABLET | Freq: Every day | ORAL | Status: DC
  Administered 2018-04-09: 09:00:00 50 mg via ORAL
  Filled 2018-04-08: qty 1

## 2018-04-08 MED ORDER — SODIUM CHLORIDE 0.9 % WEIGHT BASED INFUSION: 3.0000 mL/kg/h | INTRAVENOUS | Status: DC

## 2018-04-08 MED ORDER — HEPARIN (PORCINE) IN NACL 1000-0.9 UT/500ML-% IV SOLN
INTRAVENOUS | Status: DC | PRN
  Administered 2018-04-08 (×2): 500 mL

## 2018-04-08 MED ORDER — CLOPIDOGREL BISULFATE 300 MG PO TABS
ORAL_TABLET | ORAL | Status: DC | PRN
  Administered 2018-04-08: 600 mg via ORAL

## 2018-04-08 MED ORDER — LEVOCETIRIZINE DIHYDROCHLORIDE 5 MG PO TABS: 5.0000 mg | ORAL_TABLET | Freq: Every evening | ORAL | Status: DC

## 2018-04-08 MED ORDER — LIDOCAINE HCL (PF) 1 % IJ SOLN
INTRAMUSCULAR | Status: DC | PRN
  Administered 2018-04-08: 18 mL
  Administered 2018-04-08: 2 mL

## 2018-04-08 MED ORDER — LABETALOL HCL 5 MG/ML IV SOLN: 10.0000 mg | INTRAVENOUS | Status: AC | PRN

## 2018-04-08 MED ORDER — HEPARIN SODIUM (PORCINE) 1000 UNIT/ML IJ SOLN
INTRAMUSCULAR | Status: DC | PRN
  Administered 2018-04-08: 5000 [IU] via INTRAVENOUS

## 2018-04-08 MED ORDER — HYDRALAZINE HCL 20 MG/ML IJ SOLN: 5.0000 mg | INTRAMUSCULAR | Status: AC | PRN

## 2018-04-08 MED ORDER — BUSPIRONE HCL 15 MG PO TABS
30.0000 mg | ORAL_TABLET | Freq: Two times a day (BID) | ORAL | Status: DC
  Filled 2018-04-08: qty 2

## 2018-04-08 MED ORDER — SODIUM CHLORIDE 0.9 % IV SOLN
1.7500 mg/kg/h | INTRAVENOUS | Status: AC
  Filled 2018-04-08: qty 250

## 2018-04-08 SURGICAL SUPPLY — 24 items
BALLN SAPPHIRE 2.5X20 (BALLOONS) ×2
BALLN SAPPHIRE ~~LOC~~ 3.5X15 (BALLOONS) ×2 IMPLANT
BALLOON SAPPHIRE 2.5X20 (BALLOONS) ×1 IMPLANT
CATH 5FR JL3.5 JR4 ANG PIG MP (CATHETERS) ×2 IMPLANT
CATH INFINITI 5 FR 3DRC (CATHETERS) ×2 IMPLANT
CATH INFINITI 5FR JL4 (CATHETERS) ×2 IMPLANT
CATH LAUNCHER 5F EBU3.5 (CATHETERS) ×2 IMPLANT
CATH LAUNCHER 6FR EBU 3.75 (CATHETERS) ×2 IMPLANT
GLIDESHEATH SLEND SS 6F .021 (SHEATH) ×2 IMPLANT
GUIDEWIRE INQWIRE 1.5J.035X260 (WIRE) ×1 IMPLANT
INQWIRE 1.5J .035X260CM (WIRE) ×2
KIT ENCORE 26 ADVANTAGE (KITS) ×2 IMPLANT
KIT HEART LEFT (KITS) ×2 IMPLANT
KIT HEMO VALVE WATCHDOG (MISCELLANEOUS) ×2 IMPLANT
PACK CARDIAC CATHETERIZATION (CUSTOM PROCEDURE TRAY) ×2 IMPLANT
SHEATH PINNACLE 5F 10CM (SHEATH) ×2 IMPLANT
SHEATH PINNACLE 6F 10CM (SHEATH) ×2 IMPLANT
SHEATH PROBE COVER 6X72 (BAG) ×2 IMPLANT
STENT SYNERGY DES 2.5X16 (Permanent Stent) ×2 IMPLANT
STENT SYNERGY DES 3X38 (Permanent Stent) ×2 IMPLANT
TRANSDUCER W/STOPCOCK (MISCELLANEOUS) ×2 IMPLANT
TUBING CIL FLEX 10 FLL-RA (TUBING) ×2 IMPLANT
WIRE ASAHI PROWATER 180CM (WIRE) ×2 IMPLANT
WIRE EMERALD 3MM-J .035X150CM (WIRE) ×2 IMPLANT

## 2018-04-08 NOTE — Progress Notes (Signed)
Site area: right groin  Site Prior to Removal:  Level 0  Pressure Applied For 20 MINUTES    Minutes Beginning at 1638  Manual:   Yes.    Patient Status During Pull:  AAO X 4  Post Pull Groin Site:  Level 0  Post Pull Instructions Given:  Yes.    Post Pull Pulses Present:  Yes.    Dressing Applied:  Yes.    Comments:  Tolerated procedure well

## 2018-04-08 NOTE — Interval H&P Note (Signed)
Cath Lab Visit (complete for each Cath Lab visit)  Clinical Evaluation Leading to the Procedure:   ACS: No.  Non-ACS:    Anginal Classification: CCS III  Anti-ischemic medical therapy: Minimal Therapy (1 class of medications)  Non-Invasive Test Results: No non-invasive testing performed  Prior CABG: No previous CABG      History and Physical Interval Note:  04/08/2018 10:49 AM  Aaron Reese  has presented today for surgery, with the diagnosis of cp  The various methods of treatment have been discussed with the patient and family. After consideration of risks, benefits and other options for treatment, the patient has consented to  Procedure(s): LEFT HEART CATH AND CORONARY ANGIOGRAPHY (N/A) as a surgical intervention .  The patient's history has been reviewed, patient examined, no change in status, stable for surgery.  I have reviewed the patient's chart and labs.  Questions were answered to the patient's satisfaction.     Larae Grooms

## 2018-04-09 ENCOUNTER — Encounter (HOSPITAL_COMMUNITY): Payer: Self-pay | Admitting: Physician Assistant

## 2018-04-09 DIAGNOSIS — I10 Essential (primary) hypertension: Secondary | ICD-10-CM

## 2018-04-09 DIAGNOSIS — I25118 Atherosclerotic heart disease of native coronary artery with other forms of angina pectoris: Secondary | ICD-10-CM | POA: Diagnosis not present

## 2018-04-09 LAB — CBC
HEMATOCRIT: 45.9 % (ref 39.0–52.0)
Hemoglobin: 14.8 g/dL (ref 13.0–17.0)
MCH: 28.4 pg (ref 26.0–34.0)
MCHC: 32.2 g/dL (ref 30.0–36.0)
MCV: 88.1 fL (ref 80.0–100.0)
PLATELETS: 253 10*3/uL (ref 150–400)
RBC: 5.21 MIL/uL (ref 4.22–5.81)
RDW: 13.2 % (ref 11.5–15.5)
WBC: 9.9 10*3/uL (ref 4.0–10.5)
nRBC: 0 % (ref 0.0–0.2)

## 2018-04-09 LAB — BASIC METABOLIC PANEL
Anion gap: 8 (ref 5–15)
BUN: 14 mg/dL (ref 6–20)
CALCIUM: 8.4 mg/dL — AB (ref 8.9–10.3)
CHLORIDE: 106 mmol/L (ref 98–111)
CO2: 23 mmol/L (ref 22–32)
CREATININE: 1.08 mg/dL (ref 0.61–1.24)
GFR calc Af Amer: >60 mL/min (ref >60)
GFR calc non Af Amer: >60 mL/min (ref >60)
Glucose, Bld: 135 mg/dL — ABNORMAL HIGH (ref 70–99)
Potassium: 3.7 mmol/L (ref 3.5–5.1)
Sodium: 137 mmol/L (ref 135–145)

## 2018-04-09 MED ORDER — ATORVASTATIN CALCIUM 80 MG PO TABS: 80.0000 mg | ORAL_TABLET | Freq: Every day | ORAL | Status: DC

## 2018-04-09 MED ORDER — NITROGLYCERIN 0.4 MG SL SUBL: 0.4000 mg | SUBLINGUAL_TABLET | SUBLINGUAL | 2 refills | Status: AC | PRN

## 2018-04-09 MED ORDER — ATORVASTATIN CALCIUM 80 MG PO TABS: 80.0000 mg | ORAL_TABLET | Freq: Every day | ORAL | 2 refills | Status: DC

## 2018-04-09 MED ORDER — CLOPIDOGREL BISULFATE 75 MG PO TABS: 75.0000 mg | ORAL_TABLET | Freq: Every day | ORAL | 6 refills | Status: DC

## 2018-04-09 MED FILL — NITROGLYCERIN 0.4 MG TAB SL: 0.4 | 25 days supply | Qty: 25 | Fill #0 | Status: TO

## 2018-04-09 MED FILL — ATORVASTATIN CALCIUM 80 MG: 80 | 30 days supply | Qty: 30 | Fill #0 | Status: TO

## 2018-04-09 MED FILL — CLOPIDOGREL 75 MG TABLET: 75 | 30 days supply | Qty: 30 | Fill #0 | Status: TO

## 2018-04-09 NOTE — Progress Notes (Signed)
TR BAND REMOVAL  LOCATION:    right radial  DEFLATED PER PROTOCOL:    Yes.    TIME BAND OFF / DRESSING APPLIED:    1940   SITE UPON ARRIVAL:    Level 0  SITE AFTER BAND REMOVAL:    Level 0  CIRCULATION SENSATION AND MOVEMENT:    Within Normal Limits   Yes.    COMMENTS:  Post TR band instructions given, DSD applied, good cap refill

## 2018-04-09 NOTE — Discharge Summary (Addendum)
Discharge Summary    Patient ID: Aaron Reese,  MRN: [id number], DOB/AGE: 12-09-1965 52 y.o.  Admit date: 04/08/2018 Discharge date: 04/09/2018  Primary Care Provider: Jacqlyn Larsen II Primary Cardiologist: Dr. Bettina Gavia  Discharge Diagnoses    Active Problems:   CAD (coronary artery disease)   Hypercholesterolemia   Hypertension   Allergies Allergies  Allergen Reactions  . Citalopram Other (See Comments)    "ineffective"  . Sertraline Other (See Comments)    "ineffective"  . Venlafaxine Other (See Comments)    "limited efficacy"    Diagnostic Studies/Procedures    Cath: 04/08/18   Mid LAD lesion is 75% stenosed.  A drug-eluting stent was successfully placed using a STENT SYNERGY DES 2.5X16.  Post intervention, there is a 0% residual stenosis.  Prox LAD lesion is 80% stenosed.  A drug-eluting stent was successfully placed using a STENT SYNERGY DES 3X38.  Post intervention, there is a 0% residual stenosis.  Mid Cx lesion is 25% stenosed.  The left ventricular systolic function is normal.  LV end diastolic pressure is normal. LVEDP 5 mm Hg.  The left ventricular ejection fraction is 55-65% by visual estimate.  There is no aortic valve stenosis.  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 Would not attempt right radial access in the future.  Recommend uninterrupted dual antiplatelet therapy with Aspirin 81mg  daily and Clopidogrel 75mg  daily for a minimum of 6 months (stable ischemic heart disease - Class I recommendation).   Continue aggressive secondary prevention.   Watch overnight with plan for discharge in AM. _____________   History of Present Illness     52 y.o. male who was seen in the office by Dr. Bettina Gavia the evaluation of chest pain at the request of Dr. Reading He was seen in 2010 as an inpatient [facility] referred for coronary angiography for chest pain and have mild nonobstructive CAD.  For the last several  months he had been having chest pain initially was quite vague nonexertional atypical in nature but his symptoms had progressed since he had pneumonia 10 weeks ago and complained of very typical angina pectoris.  He described substernal tightness and burning which was moderate in severity worsened with activity and improved with rest but was happening at rest and has awakened him from his sleep at night and has increased in frequency.  He had an episode walking in from the car to the office today.  His previous EKGs and EKG remained normal.  Reviewed the option of referral for noninvasive testing or cardiac CTA but with known CAD and rapid progression of symptoms [place] angina classification 3 advised he undergo coronary angiography and initiate medical therapy with aspirin and beta-blocker.  He was set up for outpatient cardiac cath.   Hospital Course     Underwent cardiac cath noted above with successful PCI x2 to the mLAD. Plan for DAPT with ASA/plavix for at least 6 months. Will switch Zocor for Lipitor 80mg  this admission. Reports possible issues with LFTs in the past, will plan to recheck in 6 weeks. No recurrent chest pain. Worked well with cardiac rehab. He was continued on his home medications including BB and ACEi. EF noted at 55% on LV gram.   General: Well developed, well nourished, male appearing in no acute distress. Head: Normocephalic, atraumatic.  Neck: Supple without bruits, JVD. Lungs:  Resp regular and unlabored, CTA. Heart: RRR, S1, S2, no S3, S4, or murmur; no rub. Abdomen: Soft, non-tender, non-distended with normoactive bowel  sounds. No hepatomegaly. No rebound/guarding. No obvious abdominal masses. Extremities: No clubbing, cyanosis, edema. Distal pedal pulses are 2+ bilaterally. R radial/femoral cath site stable with no hematoma Neuro: Alert and oriented X 3. Moves all extremities spontaneously. Psych: Normal affect.  Aaron Reese was seen by Dr. Burt Knack and determined  stable for discharge home. Follow up in the office has been arranged. Medications are listed below.   _____________  Discharge Vitals Blood pressure 126/81, pulse 75, temperature 97.6 F (36.4 C), temperature source Oral, resp. rate 17, height 6' (1.829 m), weight 108 kg, SpO2 94 %.  Filed Weights   04/08/18 0720 04/09/18 0337  Weight: 106.6 kg 108 kg    Labs & Radiologic Studies    CBC Recent Labs    04/09/18 0322  WBC 9.9  HGB 14.8  HCT 45.9  MCV 88.1  PLT 998   Basic Metabolic Panel Recent Labs    04/09/18 0322  NA 137  K 3.7  CL 106  CO2 23  GLUCOSE 135*  BUN 14  CREATININE 1.08  CALCIUM 8.4*   Liver Function Tests No results for input(s): AST, ALT, ALKPHOS, BILITOT, PROT, ALBUMIN in the last 72 hours. No results for input(s): LIPASE, AMYLASE in the last 72 hours. Cardiac Enzymes No results for input(s): CKTOTAL, CKMB, CKMBINDEX, TROPONINI in the last 72 hours. BNP Invalid input(s): POCBNP D-Dimer No results for input(s): DDIMER in the last 72 hours. Hemoglobin A1C No results for input(s): HGBA1C in the last 72 hours. Fasting Lipid Panel No results for input(s): CHOL, HDL, LDLCALC, TRIG, CHOLHDL, LDLDIRECT in the last 72 hours. Thyroid Function Tests No results for input(s): TSH, T4TOTAL, T3FREE, THYROIDAB in the last 72 hours.  Invalid input(s): FREET3 _____________  Dg Chest 2 View  Result Date: 03/31/2018 CLINICAL DATA:  Chest pain. EXAM: CHEST - 2 VIEW COMPARISON:  Radiograph of November 22, 2008. FINDINGS: The heart size and mediastinal contours are within normal limits. No pneumothorax or pleural effusion is noted. Mild bibasilar subsegmental atelectasis is noted. The visualized skeletal structures are unremarkable. IMPRESSION: Mild bibasilar subsegmental atelectasis. Electronically Signed   By: Marijo Conception, M.D.   On: 03/31/2018 15:25   Ct Chest Wo Contrast  Result Date: 04/03/2018 CLINICAL DATA:  Followup atelectasis. Cough and congestion for  3 months EXAM: CT CHEST WITHOUT CONTRAST TECHNIQUE: Multidetector CT imaging of the chest was performed following the standard protocol without IV contrast. COMPARISON:  Chest radiograph 03/31/2018 FINDINGS: Cardiovascular: Normal heart size. Aortic atherosclerosis. Calcification in the LAD and left circumflex coronary artery identified. Mediastinum/Nodes: Normal appearance of the thyroid gland. The trachea appears patent and is midline. No supraclavicular or axillary adenopathy. No mediastinal or hilar adenopathy. Lungs/Pleura: No pleural effusions. Subsegmental atelectasis versus scar identified in the right middle lobe, lingula and both lower lobes. No airspace consolidation. No suspicious pulmonary nodule or mass identified. Upper Abdomen: No acute abnormality. Musculoskeletal: No chest wall mass or suspicious bone lesions identified. IMPRESSION: 1. Bilateral lower lobe, lingula and right middle lobe scar versus subsegmental atelectasis is identified. When compared with CT from 10/08/2016 this appears slightly progressive. 2. No suspicious mass. 3.  Aortic Atherosclerosis (ICD10-I70.0). 4. Coronary artery atherosclerotic calcifications. Electronically Signed   By: Kerby Moors M.D.   On: 04/03/2018 10:45   Disposition   Pt is being discharged home today in good condition.  Follow-up Plans & Appointments    Follow-up Information    Richardo Priest, MD Follow up on 04/14/2018.   Specialty:  Cardiology Why:  at 2:40pm for your follow up appt.  Contact information: [place] [address] [phone]          Discharge Instructions    AMB Referral to Cardiac Rehabilitation - Phase II   Complete by:  As directed    Diagnosis:  Coronary Stents   Call MD for:  redness, tenderness, or signs of infection (pain, swelling, redness, odor or green/yellow discharge around incision site)   Complete by:  As directed    Diet - low sodium heart healthy   Complete by:  As  directed    Discharge instructions   Complete by:  As directed    Radial Site Care Refer to this sheet in the next few weeks. These instructions provide you with information on caring for yourself after your procedure. Your caregiver may also give you more specific instructions. Your treatment has been planned according to current medical practices, but problems sometimes occur. Call your caregiver if you have any problems or questions after your procedure. HOME CARE INSTRUCTIONS You may shower the day after the procedure.Remove the bandage (dressing) and gently wash the site with plain soap and water.Gently pat the site dry.  Do not apply powder or lotion to the site.  Do not submerge the affected site in water for 3 to 5 days.  Inspect the site at least twice daily.  Do not flex or bend the affected arm for 24 hours.  No lifting over 5 pounds (2.3 kg) for 5 days after your procedure.  Do not drive home if you are discharged the same day of the procedure. Have someone else drive you.  You may drive 24 hours after the procedure unless otherwise instructed by your caregiver.  What to expect: Any bruising will usually fade within 1 to 2 weeks.  Blood that collects in the tissue (hematoma) may be painful to the touch. It should usually decrease in size and tenderness within 1 to 2 weeks.  SEEK IMMEDIATE MEDICAL CARE IF: You have unusual pain at the radial site.  You have redness, warmth, swelling, or pain at the radial site.  You have drainage (other than a small amount of blood on the dressing).  You have chills.  You have a fever or persistent symptoms for more than 72 hours.  You have a fever and your symptoms suddenly get worse.  Your arm becomes pale, cool, tingly, or numb.  You have heavy bleeding from the site. Hold pressure on the site.   Groin Site Care Refer to this sheet in the next few weeks. These instructions provide you with information on caring for yourself after your  procedure. Your caregiver may also give you more specific instructions. Your treatment has been planned according to current medical practices, but problems sometimes occur. Call your caregiver if you have any problems or questions after your procedure. HOME CARE INSTRUCTIONS You may shower 24 hours after the procedure. Remove the bandage (dressing) and gently wash the site with plain soap and water. Gently pat the site dry.  Do not apply powder or lotion to the site.  Do not sit in a bathtub, swimming pool, or whirlpool for 5 to 7 days.  No bending, squatting, or lifting anything over 10 pounds (4.5 kg) as directed by your caregiver.  Inspect the site at least twice daily.  Do not drive home if you are discharged the same day of the procedure. Have someone else drive you.  You may drive  24 hours after the procedure unless otherwise instructed by your caregiver.  What to expect: Any bruising will usually fade within 1 to 2 weeks.  Blood that collects in the tissue (hematoma) may be painful to the touch. It should usually decrease in size and tenderness within 1 to 2 weeks.  SEEK IMMEDIATE MEDICAL CARE IF: You have unusual pain at the groin site or down the affected leg.  You have redness, warmth, swelling, or pain at the groin site.  You have drainage (other than a small amount of blood on the dressing).  You have chills.  You have a fever or persistent symptoms for more than 72 hours.  You have a fever and your symptoms suddenly get worse.  Your leg becomes pale, cool, tingly, or numb.  You have heavy bleeding from the site. Hold pressure on the site. Marland Kitchen  PLEASE DO NOT MISS ANY DOSES OF YOUR PLAVIX!!!!! Also keep a log of you blood pressures and bring back to your follow up appt. Please call the office with any questions.   Patients taking blood thinners should generally stay away from medicines like ibuprofen, Advil, Motrin, naproxen, and Aleve due to risk of stomach bleeding. You may  take Tylenol as directed or talk to your primary doctor about alternatives.   Increase activity slowly   Complete by:  As directed        Discharge Medications     Medication List    STOP taking these medications   simvastatin 40 MG tablet Commonly known as:  ZOCOR     TAKE these medications   ALPRAZolam 0.25 MG tablet Commonly known as:  XANAX Take 2 tablets (0.5 mg total) by mouth daily as needed for anxiety. What changed:  how much to take   aspirin EC 81 MG tablet Take 1 tablet (81 mg total) by mouth daily.   atorvastatin 80 MG tablet Commonly known as:  LIPITOR Take 1 tablet (80 mg total) by mouth daily at 6 PM.   busPIRone 15 MG tablet Commonly known as:  BUSPAR Take 15 mg by mouth. 1 in qam, 2 qhs x 1 wk, then 2 + 2   clopidogrel 75 MG tablet Commonly known as:  PLAVIX Take 1 tablet (75 mg total) by mouth daily with breakfast.   levocetirizine 5 MG tablet Commonly known as:  XYZAL Take 5 mg by mouth every evening.   lisinopril 10 MG tablet Commonly known as:  PRINIVIL,ZESTRIL Take 10 mg by mouth every evening.   metoprolol succinate 50 MG 24 hr tablet Commonly known as:  TOPROL-XL Take 1 tablet (50 mg total) by mouth daily. Take with or immediately following a meal.   nitroGLYCERIN 0.4 MG SL tablet Commonly known as:  NITROSTAT Place 1 tablet (0.4 mg total) under the tongue every 5 (five) minutes as needed.   pantoprazole 40 MG tablet Commonly known as:  PROTONIX Take 40 mg by mouth every evening.   ranitidine 150 MG tablet Commonly known as:  ZANTAC Take 150 mg by mouth 1 day or 1 dose.   vortioxetine HBr 20 MG Tabs tablet Commonly known as:  TRINTELLIX Take 20 mg by mouth every evening.        Acute coronary syndrome (MI, NSTEMI, STEMI, etc) this admission?: Yes.     AHA/ACC Clinical Performance & Quality Measures: 1. Aspirin prescribed? - Yes 2. ADP Receptor Inhibitor (Plavix/Clopidogrel, Brilinta/Ticagrelor or Effient/Prasugrel)  prescribed (includes medically managed patients)? - Yes 3. Beta Blocker prescribed? - Yes 4.  High Intensity Statin (Lipitor 40-80mg  or Crestor 20-40mg ) prescribed? - Yes 5. EF assessed during THIS hospitalization? - Yes 6. For EF <40%, was ACEI/ARB prescribed? - Yes 7. For EF <40%, Aldosterone Antagonist (Spironolactone or Eplerenone) prescribed? - Not Applicable (EF >/= 53%) 8. Cardiac Rehab Phase II ordered (Included Medically managed Patients)? - Yes    Outstanding Labs/Studies   FLP/LFTs in 6 weeks.   Duration of Discharge Encounter   Greater than 30 minutes including physician time.  Signed, Reino Bellis NP-C 04/09/2018, 8:13 AM   Patient seen, examined. Available data reviewed. Agree with findings, assessment, and plan as outlined by Reino Bellis, NP-C.  On my exam the patient is alert, oriented, in no distress, JVP is normal, lungs are clear, heart is regular rate and rhythm with no murmur or gallop, abdomen is soft and nontender, there is no pretibial edema, the right radial site is clear with no evidence of hematoma.  Her graft I have reviewed the patient's cardiac catheterization data from yesterday.  He underwent uncomplicated PCI of the LAD with 2 drug-eluting stents.  I agree with recommendations for medical therapy including aspirin and clopidogrel uninterrupted for 6 months followed by lifelong aspirin.  We discussed lifestyle modification today.  We reviewed post PCI restrictions and instructions.  The patient is scheduled to follow-up with Dr. Bettina Gavia. Otherwise as outlined above.   Sherren Mocha, M.D. 04/09/2018 10:10 AM

## 2018-04-09 NOTE — Progress Notes (Signed)
CARDIAC REHAB PHASE I   PRE:  Rate/Rhythm: 78 SR  BP:  Supine: 135/80  Sitting:   Standing:    SaO2:   MODE:  Ambulation: 800 ft   POST:  Rate/Rhythm: 92 SR  BP:  Supine:   Sitting: 143/86  Standing:    SaO2: 95%RA 0805-0853 Pt walked 800 ft on RA with steady gait and no CP. C/o right groin sore from cath. Reviewed importance of plavix with stent. Reviewed NTG use, risk factors, ex ed and heart healthy diet choices. Discussed CRP 2 and referred to [facility] program.   Graylon Good, RN BSN  04/09/2018 8:48 AM

## 2018-04-10 ENCOUNTER — Encounter (HOSPITAL_COMMUNITY): Payer: Self-pay | Admitting: Interventional Cardiology

## 2018-04-11 NOTE — Progress Notes (Signed)
Cardiology Office Note:    Date:  04/14/2018   ID:  Aaron Reese, DOB 01/29/1966, MRN [id number]  PCP:  Angelina Sheriff, MD  Cardiologist:  Shirlee More, MD    Referring MD: Angelina Sheriff, MD    ASSESSMENT:    1. Coronary artery disease of native artery of native heart with stable angina pectoris ([place])   2. Essential hypertension   3. Hypercholesterolemia   4. Benign essential hypertension    PLAN:    In order of problems listed above:  1. He is quite improved has had no anginal discomfort since his PCI and stent to stay on dual antiplatelet therapy for 1 year and continue other therapy including high intensity statin.  He is back to full activities and work and will see in the office in 6 months lifestyle recommendation given with diet Stable continue current treatment Continue his high intensity statin   Next appointment: 6 months   Medication Adjustments/Labs and Tests Ordered: Current medicines are reviewed at length with the patient today.  Concerns regarding medicines are outlined above.  No orders of the defined types were placed in this encounter.  No orders of the defined types were placed in this encounter.   No chief complaint on file.   History of Present Illness:    Aaron Reese is a 52 y.o. male with a hx of mild nonobstructive CAD in 2010 hypertension hyperlipidemia who developed a rapid progression in his anginal pattern class III and underwent coronary angiography.  He was last seen 03/31/2018.  He underwent coronary angiography was found to have severe mid and proximal LAD stenosis and received 2 drug-eluting stents 04/08/18.  He is improved he has had no anginal discomfort dyspnea palpitation still has a cough that is gradually resolving and no sputum production his chest x-ray was abnormal and CT of the chest with atelectasis versus scarring has arrangements for follow-up. Compliance with diet, lifestyle and medications: yes Past Medical  History:  Diagnosis Date  . Anxiety 03/24/2018  . Benign essential hypertension 03/24/2018  . CAD (coronary artery disease)    s/p DES to pLAD & mLAD 04/08/18  . Depression 03/24/2018  . Frequent headaches 03/24/2018  . GERD (gastroesophageal reflux disease) 03/24/2018  . Hypercholesterolemia 03/24/2018  . Hypotestosteronemia 03/24/2018  . Substernal chest pain 03/24/2018    Past Surgical History:  Procedure Laterality Date  . CARDIAC CATHETERIZATION    . CORONARY STENT INTERVENTION N/A 04/08/2018   Procedure: CORONARY STENT INTERVENTION;  Surgeon: Jettie Booze, MD;  Location: [facility];  Service: Cardiovascular;  Laterality: N/A;  . LEFT HEART CATH AND CORONARY ANGIOGRAPHY N/A 04/08/2018   Procedure: LEFT HEART CATH AND CORONARY ANGIOGRAPHY;  Surgeon: Jettie Booze, MD;  Location: [facility];  Service: Cardiovascular;  Laterality: N/A;    Current Medications: Current Meds  Medication Sig  . aspirin EC 81 MG tablet Take 1 tablet (81 mg total) by mouth daily.  Marland Kitchen atorvastatin (LIPITOR) 80 MG tablet Take 1 tablet (80 mg total) by mouth daily at 6 PM.  . clopidogrel (PLAVIX) 75 MG tablet Take 1 tablet (75 mg total) by mouth daily with breakfast.  . levocetirizine (XYZAL) 5 MG tablet Take 5 mg by mouth every evening.   Marland Kitchen lisinopril (PRINIVIL,ZESTRIL) 10 MG tablet Take 10 mg by mouth every evening.   . metoprolol succinate (TOPROL XL) 50 MG 24 hr tablet Take 1 tablet (50 mg total) by mouth daily. Take with or  immediately following a meal.  . nitroGLYCERIN (NITROSTAT) 0.4 MG SL tablet Place 1 tablet (0.4 mg total) under the tongue every 5 (five) minutes as needed.  . pantoprazole (PROTONIX) 40 MG tablet Take 40 mg by mouth every evening.   . ranitidine (ZANTAC) 150 MG tablet Take 150 mg by mouth 1 day or 1 dose.  . vortioxetine HBr (TRINTELLIX) 20 MG TABS tablet Take 20 mg by mouth every evening.      Allergies:   Citalopram; Sertraline; and Venlafaxine    Social History   Socioeconomic History  . Marital status: Married    Spouse name: Not on file  . Number of children: Not on file  . Years of education: Not on file  . Highest education level: Not on file  Occupational History  . Not on file  Social Needs  . Financial resource strain: Not on file  . Food insecurity:    Worry: Not on file    Inability: Not on file  . Transportation needs:    Medical: Not on file    Non-medical: Not on file  Tobacco Use  . Smoking status: Never Smoker  . Smokeless tobacco: Never Used  Substance and Sexual Activity  . Alcohol use: Not Currently    Frequency: Never  . Drug use: Not Currently  . Sexual activity: Not on file  Lifestyle  . Physical activity:    Days per week: Not on file    Minutes per session: Not on file  . Stress: Not on file  Relationships  . Social connections:    Talks on phone: Not on file    Gets together: Not on file    Attends religious service: Not on file    Active member of club or organization: Not on file    Attends meetings of clubs or organizations: Not on file    Relationship status: Not on file  Other Topics Concern  . Not on file  Social History Narrative  . Not on file     Family History: The patient's family history includes ALS in his father; Hypertension in his mother; Stroke in his brother. There is no history of Heart disease, Cancer, or Diabetes. ROS:   Please see the history of present illness.    All other systems reviewed and are negative.  EKGs/Labs/Other Studies Reviewed:    The following studies were reviewed today:  EKG:  EKG ordered today.  The ekg ordered today demonstrates [facility] and normal  Recent Labs: 04/09/2018: BUN 14; Creatinine, Ser 1.08; Hemoglobin 14.8; Platelets 253; Potassium 3.7; Sodium 137  Recent Lipid Panel No results found for: CHOL, TRIG, HDL, CHOLHDL, VLDL, LDLCALC, LDLDIRECT  Physical Exam:    VS:  BP (!) 126/92 (BP Location: Right Arm, Patient Position:  Sitting, Cuff Size: Large)   Pulse 98   Ht 6' (1.829 m)   Wt 234 lb 1.9 oz (106.2 kg)   SpO2 96%   BMI 31.75 kg/m     Wt Readings from Last 3 Encounters:  04/14/18 234 lb 1.9 oz (106.2 kg)  04/09/18 238 lb 1.6 oz (108 kg)  03/31/18 239 lb 12.8 oz (108.8 kg)     GEN:  Well nourished, well developed in no acute distress HEENT: Normal NECK: No JVD; No carotid bruits LYMPHATICS: No lymphadenopathy CARDIAC: RRR, no murmurs, rubs, gallops RESPIRATORY:  Clear to auscultation without rales, wheezing or rhonchi  ABDOMEN: Soft, non-tender, non-distended MUSCULOSKELETAL:  No edema; No deformity  SKIN: Warm and dry NEUROLOGIC:  Alert and oriented x 3 PSYCHIATRIC:  Normal affect    Signed, Shirlee More, MD  04/14/2018 3:07 PM    [place]

## 2018-04-14 ENCOUNTER — Ambulatory Visit (INDEPENDENT_AMBULATORY_CARE_PROVIDER_SITE_OTHER): Payer: PRIVATE HEALTH INSURANCE | Admitting: Cardiology

## 2018-04-14 VITALS — BP 126/92 | HR 98 | Ht 72.0 in | Wt 234.1 lb

## 2018-04-14 DIAGNOSIS — E7800 Pure hypercholesterolemia, unspecified: Secondary | ICD-10-CM

## 2018-04-14 DIAGNOSIS — I10 Essential (primary) hypertension: Secondary | ICD-10-CM

## 2018-04-14 DIAGNOSIS — I25118 Atherosclerotic heart disease of native coronary artery with other forms of angina pectoris: Secondary | ICD-10-CM | POA: Diagnosis not present

## 2018-04-14 NOTE — Patient Instructions (Addendum)
Medication Instructions:  Your physician recommends that you continue on your current medications as directed. Please refer to the Current Medication list given to you today.  If you need a refill on your cardiac medications before your next appointment, please call your pharmacy.   Lab work: None  If you have labs (blood work) drawn today and your tests are completely normal, you will receive your results only by: Marland Kitchen MyChart Message (if you have MyChart) OR . A paper copy in the mail If you have any lab test that is abnormal or we need to change your treatment, we will call you to review the results.  Testing/Procedures: None  Follow-Up: At [facility], you and your health needs are our priority.  As part of our continuing mission to provide you with exceptional heart care, we have created designated Provider Care Teams.  These Care Teams include your primary Cardiologist (physician) and Advanced Practice Providers (APPs -  Physician Assistants and Nurse Practitioners) who all work together to provide you with the care you need, when you need it. You will need a follow up appointment in 6 months.  Please call our office 2 months in advance to schedule this appointment.           Mediterranean diet: A heart-healthy eating plan The heart-healthy Mediterranean diet is a healthy eating plan based on typical foods and recipes of Mediterranean-style cooking. Here's how to adopt the Mediterranean diet. By [facility] Staff  If you're looking for a heart-healthy eating plan, the Mediterranean diet might be right for you. The Mediterranean diet incorporates the basics of healthy eating - plus a splash of flavorful olive oil and perhaps a glass of red wine - among other components characterizing the traditional cooking style of countries bordering the [employer]. Most healthy diets include fruits, vegetables, fish and whole grains, and limit unhealthy fats. While these parts of a  healthy diet are tried-and-true, subtle variations or differences in proportions of certain foods may make a difference in your risk of heart disease.  Benefits of the [place] has shown that the traditional Mediterranean diet reduces the risk of heart disease. The diet has been associated with a lower level of oxidized low-density lipoprotein (LDL) cholesterol - the "bad" cholesterol that's more likely to build up deposits in your arteries. In fact, a meta-analysis of more than 1.5 million healthy adults demonstrated that following a Mediterranean diet was associated with a reduced risk of cardiovascular mortality as well as overall mortality. The Mediterranean diet is also associated with a reduced incidence of cancer, and Parkinson's and Alzheimer's diseases. Women who eat a Mediterranean diet supplemented with extra-virgin olive oil and mixed nuts may have a reduced risk of breast cancer. For these reasons, most if not all major scientific organizations encourage healthy adults to adapt a style of eating like that of the [place] for prevention of major chronic diseases.  Key components of the Mediterranean diet The Mediterranean diet emphasizes: Eating primarily plant-based foods, such as fruits and vegetables, whole grains, legumes and nuts  Replacing butter with healthy fats such as olive oil and canola oil  Using herbs and spices instead of salt to flavor foods  Limiting red meat to no more than a few times a month  Eating fish and poultry at least twice a week  Enjoying meals with family and friends  Drinking red wine in moderation (optional)  Getting plenty of exercise Fruits, vegetables, nuts and grains  The Mediterranean  diet traditionally includes fruits, vegetables, pasta and rice. For example, residents of [place] eat very little red meat and average nine servings a day of antioxidant-rich fruits and vegetables.  Grains in the [place] region are  typically whole grain and usually contain very few unhealthy trans fats, and bread is an important part of the diet there. However, throughout the [place] region, bread is eaten plain or dipped in olive oil - not eaten with butter or margarines, which contain saturated or trans fats.  Nuts are another part of a healthy Mediterranean diet. Nuts are high in fat (approximately 80 percent of their calories come from fat), but most of the fat is not saturated. Because nuts are high in calories, they should not be eaten in large amounts - generally no more than a handful a day. Avoid candied or honey-roasted and heavily salted nuts.  Healthy fats The focus of the Mediterranean diet isn't on limiting total fat consumption, but rather to make wise choices about the types of fat you eat. The Mediterranean diet discourages saturated fats and hydrogenated oils (trans fats), both of which contribute to heart disease. The Mediterranean diet features olive oil as the primary source of fat. Olive oil provides monounsaturated fat - a type of fat that can help reduce LDL cholesterol levels when used in place of saturated or trans fats.  "Extra-virgin" and "virgin" olive oils - the least processed forms - also contain the highest levels of the protective plant compounds that provide antioxidant effects. Monounsaturated fats and polyunsaturated fats, such as canola oil and some nuts, contain the beneficial linolenic acid (a type of omega-3 fatty acid). Omega-3 fatty acids lower triglycerides, decrease blood clotting, are associated with decreased sudden heart attack, improve the health of your blood vessels, and help moderate blood pressure. Fatty fish - such as mackerel, lake trout, herring, sardines, albacore tuna and salmon - are rich sources of omega-3 fatty acids. Fish is eaten on a regular basis in the [place].  Wine The health effects of alcohol have been debated for many years, and some doctors are  reluctant to encourage alcohol consumption because of the health consequences of excessive drinking. However, alcohol - in moderation - has been associated with a reduced risk of heart disease in some research studies. The Mediterranean diet typically includes a moderate amount of wine. This means no more than 5 ounces (148 milliliters) of wine daily for women (or men over age 52), and no more than 10 ounces (296 milliliters) of wine daily for men under age 26. If you're unable to limit your alcohol intake to the amounts defined above, if you have a personal or family history of alcohol abuse, or if you have heart or liver disease, refrain from drinking wine or any other alcohol.  Putting it all together The Mediterranean diet is a delicious and healthy way to eat. Many people who switch to this style of eating say they'll never eat any other way. Here are some specific steps to get you started: Eat your veggies and fruits - and switch to whole grains. An abundance and variety of plant foods should make up the majority of your meals. Strive for seven to 10 servings a day of veggies and fruits. Switch to whole-grain bread and cereal, and begin to eat more whole-grain rice and pasta products.  Go nuts. Keep almonds, cashews, pistachios and walnuts on hand for a quick snack. Choose natural peanut butter, rather than the kind with hydrogenated fat added. Try  tahini (blended sesame seeds) as a dip or spread for bread.  Pass on the butter. Try olive or canola oil as a healthy replacement for butter or margarine. Use it in cooking. Dip bread in flavored olive oil or lightly spread it on whole-grain bread for a tasty alternative to butter. Or try tahini as a dip or spread.  Spice it up. Herbs and spices make food tasty and are also rich in health-promoting substances. Season your meals with herbs and spices rather than salt.  Go fish. Eat fish once or twice a week. Fresh or water-packed tuna, salmon, trout,  mackerel and herring are healthy choices. Grilled fish tastes good and requires little cleanup. Avoid fried fish, unless it's sauteed in a small amount of canola oil.  Rein in the red meat. Substitute fish and poultry for red meat. When eaten, make sure it's lean and keep portions small (about the size of a deck of cards). Also avoid sausage, bacon and other high-fat meats.  Choose low-fat dairy. Limit higher fat dairy products such as whole or 2 percent milk, cheese and ice cream. Switch to skim milk, fat-free yogurt and low-fat cheese.  Raise a glass to healthy eating. If it's OK with your doctor, have a glass of wine at dinner. If you don't drink alcohol, you don't need to start. Drinking purple grape juice may be an alternative to wine.

## 2018-04-15 ENCOUNTER — Encounter: Payer: Self-pay | Admitting: Pulmonary Disease

## 2018-04-15 ENCOUNTER — Ambulatory Visit (INDEPENDENT_AMBULATORY_CARE_PROVIDER_SITE_OTHER): Payer: PRIVATE HEALTH INSURANCE | Admitting: Pulmonary Disease

## 2018-04-15 VITALS — BP 125/86 | HR 78 | Ht 72.0 in | Wt 235.0 lb

## 2018-04-15 DIAGNOSIS — Z77098 Contact with and (suspected) exposure to other hazardous, chiefly nonmedicinal, chemicals: Secondary | ICD-10-CM | POA: Diagnosis not present

## 2018-04-15 DIAGNOSIS — R400 Somnolence: Secondary | ICD-10-CM | POA: Diagnosis not present

## 2018-04-15 DIAGNOSIS — R0602 Shortness of breath: Secondary | ICD-10-CM | POA: Diagnosis not present

## 2018-04-15 DIAGNOSIS — J9811 Atelectasis: Secondary | ICD-10-CM | POA: Diagnosis not present

## 2018-04-15 DIAGNOSIS — T542X1A Toxic effect of corrosive acids and acid-like substances, accidental (unintentional), initial encounter: Secondary | ICD-10-CM

## 2018-04-15 NOTE — Patient Instructions (Signed)
Abnormal CT scan of your chest: This showed a condition called atelectasis which means that part of your lung segments are collapsed and some airway inflammation.  While I think that this is likely due to the pneumonia you had recently clearing up I am concerned about your chlorine gas exposure at work on the sulfuric acid you are exposed to a year ago.  I think that you should make efforts to keep your workplace well ventilated and minimize exposure to chlorine gas. We need to arrange for a repeat CT scan of your chest in 6 months to make sure this is not worsening We also need to get a lung function test to make sure that your lung function is within normal limits.  We can do the lung function test in [place].  Daytime somnolence with snoring: We will arrange for a home sleep study to evaluate for sleep apnea  Please call us about 2 to 3 days after the lung function test so that we can send you the results.  I will plan on seeing you back in about 3 to 4 weeks to go over the results of the home sleep study

## 2018-04-15 NOTE — Progress Notes (Signed)
Synopsis: Referred in 03/2018 for an abnormal CT chest; he has a past medical history significant for CAD and underwent PCI to his LAD x2 in October 2019.  Subjective:   PATIENT ID: Aaron Reese GENDER: male DOB: July 18, 1965, MRN: [id number]   HPI  Chief Complaint  Patient presents with  . Pulm Consult    Referred by Dr. Bettina Gavia for abnormal CT on 10/17.     This is a pleasant 52 year old male who has never smoked cigarettes who comes to my clinic today for evaluation of an abnormal CT scan of his chest.  He says that he had a normal childhood without respiratory illnesses but in 2019 he had an episode of pneumonia.  He says this occurred around July 2018 and he had left-sided pneumonia.  During that time he had cough, chest congestion, mucus production, and shortness of breath.  He says that that is the first time in his life he is never to experience that.  He did not require hospitalization.  He was treated with antibiotics and his symptoms cleared up to some degree.  However, he was left with persistent shortness of breath and chest tightness which was escalating over the course of about 2 months.  He saw cardiology and because of his symptoms underwent a left heart catheterization which showed significant stenosis of his left anterior descending artery.  He had a PCI performed in October 2019.  He comes to see me now saying that the chest tightness has improved but he still has some trouble with shortness of breath.  He is no longer coughing.  He is also here to follow-up on an abnormal CT scan of his chest.  In addition to all of this he says that he has been experiencing daytime sleepiness.  Specifically he says that he does not feel well rested when he wakes up in the morning and he feels that he could nap nearly every day.  His wife says that he snores but she has never witnessed apneas.  He tells me that he works for [employer] in the [place].  He has been using  chlorine D oxide to decontaminate water and he says that his exposure to this is about 1 to 2 hours/day.  He also says that he can smell the chlorine most days when he is at work.  He also says that approximately 1 year ago he had a fairly heavy exposure to sulfuric acid and felt burning in his lungs.  Past Medical History:  Diagnosis Date  . Anxiety 03/24/2018  . Benign essential hypertension 03/24/2018  . CAD (coronary artery disease)    s/p DES to pLAD & mLAD 04/08/18  . Depression 03/24/2018  . Frequent headaches 03/24/2018  . GERD (gastroesophageal reflux disease) 03/24/2018  . Hypercholesterolemia 03/24/2018  . Hypotestosteronemia 03/24/2018  . Substernal chest pain 03/24/2018     Family History  Problem Relation Age of Onset  . Hypertension Mother   . ALS Father   . Stroke Brother   . Heart disease Neg Hx   . Cancer Neg Hx   . Diabetes Neg Hx      Social History   Socioeconomic History  . Marital status: Married    Spouse name: Not on file  . Number of children: Not on file  . Years of education: Not on file  . Highest education level: Not on file  Occupational History  . Not on file  Social Needs  . 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 Food insecurity:    Worry: Not on file    Inability: Not on file  . Transportation needs:    Medical: Not on file    Non-medical: Not on file  Tobacco Use  . Smoking status: Never Smoker  . Smokeless tobacco: Never Used  Substance and Sexual Activity  . Alcohol use: Not Currently    Frequency: Never  . Drug use: Not Currently  . Sexual activity: Not on file  Lifestyle  . Physical activity:    Days per week: Not on file    Minutes per session: Not on file  . Stress: Not on file  Relationships  . Social connections:    Talks on phone: Not on file    Gets together: Not on file    Attends religious service: Not on file    Active member of club or organization: Not on file    Attends meetings of clubs or organizations: Not  on file    Relationship status: Not on file  . Intimate partner violence:    Fear of current or ex partner: Not on file    Emotionally abused: Not on file    Physically abused: Not on file    Forced sexual activity: Not on file  Other Topics Concern  . Not on file  Social History Narrative  . Not on file     Allergies  Allergen Reactions  . Citalopram Other (See Comments)    "ineffective"  . Sertraline Other (See Comments)    "ineffective"  . Venlafaxine Other (See Comments)    "limited efficacy"     Outpatient Medications Prior to Visit  Medication Sig Dispense Refill  . aspirin EC 81 MG tablet Take 1 tablet (81 mg total) by mouth daily. 90 tablet 3  . atorvastatin (LIPITOR) 80 MG tablet Take 1 tablet (80 mg total) by mouth daily at 6 PM. 30 tablet 2  . clopidogrel (PLAVIX) 75 MG tablet Take 1 tablet (75 mg total) by mouth daily with breakfast. 30 tablet 6  . levocetirizine (XYZAL) 5 MG tablet Take 5 mg by mouth every evening.   1  . lisinopril (PRINIVIL,ZESTRIL) 10 MG tablet Take 10 mg by mouth every evening.   1  . metoprolol succinate (TOPROL XL) 50 MG 24 hr tablet Take 1 tablet (50 mg total) by mouth daily. Take with or immediately following a meal. 30 tablet 3  . nitroGLYCERIN (NITROSTAT) 0.4 MG SL tablet Place 1 tablet (0.4 mg total) under the tongue every 5 (five) minutes as needed. 25 tablet 2  . pantoprazole (PROTONIX) 40 MG tablet Take 40 mg by mouth every evening.     . ranitidine (ZANTAC) 150 MG tablet Take 150 mg by mouth 1 day or 1 dose.    . vortioxetine HBr (TRINTELLIX) 20 MG TABS tablet Take 20 mg by mouth every evening.      No facility-administered medications prior to visit.     ROS    Objective:  Physical Exam   Vitals:   04/15/18 1509  BP: 125/86  Pulse: 78  SpO2: 95%  Weight: 235 lb (106.6 kg)  Height: 6' (1.829 m)    Gen: well appearing, no acute distress HENT: NCAT, OP clear, neck supple without masses Eyes: PERRL, EOMi Lymph: no  cervical lymphadenopathy PULM: Few crackles bases, otherwise clear CV: RRR, no mgr, no JVD GI: BS+, soft, nontender, no hsm Derm: no rash or skin breakdown MSK: normal bulk and tone  Neuro: A&Ox4, CN II-XII intact, strength 5/5 in all 4 extremities Psyche: normal mood and affect   CBC    Component Value Date/Time   WBC 9.9 04/09/2018 0322   RBC 5.21 04/09/2018 0322   HGB 14.8 04/09/2018 0322   HGB 17.9 (H) 03/31/2018 1154   HCT 45.9 04/09/2018 0322   HCT 51.0 03/31/2018 1154   PLT 253 04/09/2018 0322   PLT 229 03/31/2018 1154   MCV 88.1 04/09/2018 0322   MCV 88 03/31/2018 1154   MCH 28.4 04/09/2018 0322   MCHC 32.2 04/09/2018 0322   RDW 13.2 04/09/2018 0322   RDW 13.8 03/31/2018 1154     Chest imaging: October 2019 CT chest images independently reviewed showing normal pulmonary parenchyma in the upper lobes but in the bases there is atelectasis and some airway thickening with surrounding groundglass.  Compared to in April 2018 CT scan of his abdomen this does appear to be progressive.  PFT:  Labs:  Path:  Echo:  Heart Catheterization: April 08, 2018 showed mid LAD lesion 75% narrowed, drug-eluting stent placed, proximal LAD lesion 80% stenosis, drug-eluting stent placed, left ventricular systolic function was normal, LVEDP 5 mmHg no aortic valve stenosis      Assessment & Plan:   Shortness of breath - Plan: Pulmonary function test  Atelectasis - Plan: CT Chest Wo Contrast  Daytime somnolence - Plan: Home sleep test  Chlorine gas exposure  Toxic effect of sulfuric acid, accidental or unintentional, initial encounter  Discussion: This is a pleasant 53 year old male who comes to my clinic today for evaluation of 2 problems: Daytime somnolence with heavy snoring as well as an abnormal CT scan of his chest.  In regards to the daytime somnolence he has an increase in exercise and signs and symptoms which are consistent with sleep apnea so his pretest probability  of having sleep apnea is quite high.  He needs to have a home sleep study.  While the abnormalities from the CT scan of his chest (airway thickening, atelectasis) are most likely due to pneumonia which has been clearing up slowly over several months, I do worry about airway centric inflammation from his chemical exposure at work.  Specifically sulfuric acid and chlorine gas can both cause airway inflammation.  I doubt he has something like a neuromuscular weakness causing restrictive lung disease which would lead to atelectasis but we should check with a lung function test.  I think he should have another CT scan of his chest in about 6 months to make sure that the airway centric inflammation and atelectasis is improving.  Plan: Abnormal CT scan of your chest: This showed a condition called atelectasis which means that part of your lung segments are collapsed and some airway inflammation.  While I think that this is likely due to the pneumonia you had recently clearing up I am concerned about your chlorine gas exposure at work on the sulfuric acid you are exposed to a year ago.  I think that you should make efforts to keep your workplace well ventilated and minimize exposure to chlorine gas. We need to arrange for a repeat CT scan of your chest in 6 months to make sure this is not worsening We also need to get a lung function test to make sure that your lung function is within normal limits.  We can do the lung function test in [place].  Daytime somnolence with snoring: We will arrange for a home sleep study to evaluate for sleep apnea  Please call us about 2 to 3 days after the lung function test so that we can send you the results.  I will plan on seeing you back in about 3 to 4 weeks to go over the results of the home sleep study    Current Outpatient Medications:  .  aspirin EC 81 MG tablet, Take 1 tablet (81 mg total) by mouth daily., Disp: 90 tablet, Rfl: 3 .  atorvastatin (LIPITOR) 80 MG  tablet, Take 1 tablet (80 mg total) by mouth daily at 6 PM., Disp: 30 tablet, Rfl: 2 .  clopidogrel (PLAVIX) 75 MG tablet, Take 1 tablet (75 mg total) by mouth daily with breakfast., Disp: 30 tablet, Rfl: 6 .  levocetirizine (XYZAL) 5 MG tablet, Take 5 mg by mouth every evening. , Disp: , Rfl: 1 .  lisinopril (PRINIVIL,ZESTRIL) 10 MG tablet, Take 10 mg by mouth every evening. , Disp: , Rfl: 1 .  metoprolol succinate (TOPROL XL) 50 MG 24 hr tablet, Take 1 tablet (50 mg total) by mouth daily. Take with or immediately following a meal., Disp: 30 tablet, Rfl: 3 .  nitroGLYCERIN (NITROSTAT) 0.4 MG SL tablet, Place 1 tablet (0.4 mg total) under the tongue every 5 (five) minutes as needed., Disp: 25 tablet, Rfl: 2 .  pantoprazole (PROTONIX) 40 MG tablet, Take 40 mg by mouth every evening. , Disp: , Rfl:  .  ranitidine (ZANTAC) 150 MG tablet, Take 150 mg by mouth 1 day or 1 dose., Disp: , Rfl:  .  vortioxetine HBr (TRINTELLIX) 20 MG TABS tablet, Take 20 mg by mouth every evening. , Disp: , Rfl:

## 2018-04-15 NOTE — Progress Notes (Signed)
   Subjective:    Patient ID: Aaron Reese, male    DOB: [birth year], 52 y.o.   MRN: [id number]  HPI    Review of Systems  Constitutional: Negative for fever and unexpected weight change.  HENT: Positive for sore throat. Negative for congestion, dental problem, ear pain, nosebleeds, postnasal drip, rhinorrhea, sinus pressure, sneezing and trouble swallowing.   Eyes: Negative for redness and itching.  Respiratory: Positive for chest tightness and shortness of breath. Negative for cough and wheezing.   Cardiovascular: Positive for chest pain. Negative for palpitations and leg swelling.  Gastrointestinal: Negative for nausea and vomiting.  Genitourinary: Negative for dysuria.  Musculoskeletal: Negative for joint swelling.  Skin: Negative for rash.  Allergic/Immunologic: Negative.  Negative for environmental allergies, food allergies and immunocompromised state.  Neurological: Positive for headaches.  Hematological: Does not bruise/bleed easily.  Psychiatric/Behavioral: Negative for dysphoric mood. The patient is not nervous/anxious.        Objective:   Physical Exam        Assessment & Plan:

## 2018-04-17 ENCOUNTER — Ambulatory Visit: Payer: PRIVATE HEALTH INSURANCE | Admitting: Psychiatry

## 2018-04-28 DIAGNOSIS — G4733 Obstructive sleep apnea (adult) (pediatric): Secondary | ICD-10-CM

## 2018-04-29 ENCOUNTER — Other Ambulatory Visit: Payer: Self-pay | Admitting: *Deleted

## 2018-04-29 DIAGNOSIS — R400 Somnolence: Secondary | ICD-10-CM

## 2018-04-30 DIAGNOSIS — G4733 Obstructive sleep apnea (adult) (pediatric): Secondary | ICD-10-CM

## 2018-05-02 ENCOUNTER — Telehealth: Payer: Self-pay | Admitting: *Deleted

## 2018-05-02 DIAGNOSIS — G4733 Obstructive sleep apnea (adult) (pediatric): Secondary | ICD-10-CM

## 2018-05-02 NOTE — Telephone Encounter (Signed)
-----   Message from Juanito Doom, MD sent at 05/01/2018  2:04 PM EST ----- Thanks  BJ,  Please let her know that I'd like her to have a CPAP titration study because the sleep study showed OSA.  Thanks, Ruby Cola ----- Message ----- From: Chesley Mires, MD Sent: 04/30/2018   1:00 PM EST To: Juanito Doom, MD  Ruby Cola,  Home sleep study from 04/28/18 >> moderate obstructive sleep apnea with an AHI of 24.3 and SpO2 low of 74%.  Thanks.  [place]

## 2018-05-02 NOTE — Telephone Encounter (Signed)
ATC patient, no room to leave a VM will leave in box to try again later.

## 2018-05-05 NOTE — Telephone Encounter (Signed)
Called and spoke to patient. Relayed results per Dr. Lake Bells. Patient verbalized understanding.  Also gave patient our new address and phone number for future reference. Placed order for CPAP titration study per Dr. Lake Bells. Nothing further needed at this time.

## 2018-05-13 ENCOUNTER — Ambulatory Visit: Payer: PRIVATE HEALTH INSURANCE | Admitting: Psychiatry

## 2018-06-23 ENCOUNTER — Encounter: Payer: Self-pay | Admitting: Gastroenterology

## 2018-06-23 ENCOUNTER — Ambulatory Visit (HOSPITAL_BASED_OUTPATIENT_CLINIC_OR_DEPARTMENT_OTHER): Payer: PRIVATE HEALTH INSURANCE | Attending: Pulmonary Disease | Admitting: Pulmonary Disease

## 2018-06-23 VITALS — Ht 72.0 in | Wt 230.0 lb

## 2018-06-23 DIAGNOSIS — G4733 Obstructive sleep apnea (adult) (pediatric): Secondary | ICD-10-CM | POA: Insufficient documentation

## 2018-06-30 ENCOUNTER — Other Ambulatory Visit: Payer: Self-pay | Admitting: Cardiology

## 2018-07-01 ENCOUNTER — Ambulatory Visit: Payer: PRIVATE HEALTH INSURANCE | Admitting: Gastroenterology

## 2018-07-06 DIAGNOSIS — G4733 Obstructive sleep apnea (adult) (pediatric): Secondary | ICD-10-CM | POA: Diagnosis not present

## 2018-07-06 NOTE — Procedures (Signed)
    Patient Name: Aaron Reese, Aaron Reese Date: 06/23/2018   Gender: Male  D.O.B: [birth year]  Age (years): 34  Referring Provider: Simonne Maffucci  Height (inches): 72  Interpreting Physician: Chesley Mires MD, ABSM  Weight (lbs): 230  RPSGT: Rebekah [place]  BMI: 31  MRN: [id number]  Neck Size: 17.00  <br> <br>  CLINICAL INFORMATION  The patient is referred for a CPAP titration to treat sleep apnea. Date of NPSG, Split Night or HST: 04/28/2018, AHI 24.3. SLEEP STUDY TECHNIQUE  As per the AASM Manual for the Scoring of Sleep and Associated Events v2.3 (April 2016) with a hypopnea requiring 4% desaturations. The channels recorded and monitored were frontal, central and occipital EEG, electrooculogram (EOG), submentalis EMG (chin), nasal and oral airflow, thoracic and abdominal wall motion, anterior tibialis EMG, snore microphone, electrocardiogram, and pulse oximetry. Continuous positive airway pressure (CPAP) was initiated at the beginning of the study and titrated to treat sleep-disordered breathing. MEDICATIONS  Medications self-administered by patient taken the night of the study : N/A TECHNICIAN COMMENTS  Comments added by technician: None Comments added by scorer: N/A  RESPIRATORY PARAMETERS  Optimal PAP Pressure (cm): 12 AHI at Optimal Pressure (/hr): 0.3  Overall Minimal O2 (%): 88.0 Supine % at Optimal Pressure (%): 60  Minimal O2 at Optimal Pressure (%): 89.0      SLEEP ARCHITECTURE  The study was initiated at 9:58:27 PM and ended at 5:06:01 AM. Sleep onset time was 17.0 minutes and the sleep efficiency was 70.0%%. The total sleep time was 299.5 minutes. The patient spent 11.4%% of the night in stage N1 sleep, 64.4%% in stage N2 sleep, 0.0%% in stage N3 and 24.2% in REM.Stage REM latency was 141.5 minutes Wake after sleep onset was 111.1. Alpha intrusion was absent. Supine sleep was 50.75%. CARDIAC DATA  The 2 lead EKG demonstrated sinus rhythm. The mean heart rate  was 61.9 beats per minute. Other EKG findings include: None.  LEG MOVEMENT DATA  The total Periodic Limb Movements of Sleep (PLMS) were 0. The PLMS index was 0.0. A PLMS index of <15 is considered normal in adults. IMPRESSIONS  - She did well with CPAP 12 cm H2O. She did not require supplemental oxygen during this study. DIAGNOSIS  - Obstructive Sleep Apnea (327.23 [G47.33 ICD-10]) RECOMMENDATIONS  - Trial of CPAP therapy on 12 cm H2O with a Small size Resmed Nasal Pillow Mask AirFit P10 mask and heated humidification. [Electronically signed] 07/06/2018 08:24 PM Chesley Mires MD, [place], American Board of Sleep Medicine  NPI: [id number]

## 2018-07-11 ENCOUNTER — Telehealth: Payer: Self-pay

## 2018-07-11 DIAGNOSIS — G4733 Obstructive sleep apnea (adult) (pediatric): Secondary | ICD-10-CM

## 2018-07-11 NOTE — Telephone Encounter (Signed)
-----   Message from Juanito Doom, MD sent at 07/09/2018 11:19 AM EST ----- Hi, Per Dr. Juanetta Gosling recommendations: Trial of CPAP therapy on 12 cm H2O with a Small size Resmed Nasal Pillow Mask AirFit P10 mask and heated humidification. Thanks, B ----- Message ----- From: Chesley Mires, MD Sent: 07/06/2018   8:25 PM EST To: Juanito Doom, MD

## 2018-07-11 NOTE — Telephone Encounter (Signed)
Spoke with pt, aware of results/recs.  cpap ordered at pt request, and scheduled rov within 31-90 day window for follow-up per insurance guidelines.  Nothing further needed at this time- will close encounter.

## 2018-07-25 ENCOUNTER — Telehealth: Payer: Self-pay

## 2018-07-25 NOTE — Telephone Encounter (Signed)
Copied from [place] [phone]. Topic: General - Other >> Jul 25, 2018 12:05 PM Leward Quan A wrote: Reason for CRM: Patient called to say that he had a Sleep study done a few weeks ago and is waiting on a C Pap machine but is yet to get a phone call. Patient is asking for a call with an update on him getting this Cpap machine. Please advise, Ph# [phone]

## 2018-07-25 NOTE — Telephone Encounter (Signed)
This was sent to [facility] by mistake. Routing to [facility] for follow up.

## 2018-07-28 ENCOUNTER — Telehealth: Payer: Self-pay | Admitting: Pulmonary Disease

## 2018-07-28 NOTE — Telephone Encounter (Signed)
I have called and spoke with [name] he advised that he would personally work on this and call the patient within 2days and I called and left the patient a detailed message to advise this.

## 2018-07-29 ENCOUNTER — Other Ambulatory Visit: Payer: Self-pay | Admitting: Cardiology

## 2018-07-30 NOTE — Telephone Encounter (Signed)
Called patient unable to reach left message to give us a call back.

## 2018-07-31 NOTE — Telephone Encounter (Signed)
LM on pt's VM to see if he had been contacted by [facility] & set up with CPAP as previously noted.

## 2018-07-31 NOTE — Telephone Encounter (Signed)
I just spoke with [name] with [facility] about this CPAP order. She stated that it looks like the patient was setup on 07/29/2018 at the [place] office. I have lvm for patient to call and confirm that he now has his equipment.

## 2018-07-31 NOTE — Telephone Encounter (Signed)
I have also called and left message with the patient to follow up on this wcb

## 2018-07-31 NOTE — Telephone Encounter (Signed)
Called James with [facility] & left vm for him to call me back with status of cpap.

## 2018-07-31 NOTE — Telephone Encounter (Signed)
cpap was ordered on 07/11/2018. PCCs please advise on status of order.  Thanks!

## 2018-08-01 NOTE — Telephone Encounter (Signed)
Spoke with pt, he states he has his CPAP set up now. Nothing further is needed.

## 2018-08-15 ENCOUNTER — Encounter: Payer: Self-pay | Admitting: Gastroenterology

## 2018-08-22 ENCOUNTER — Ambulatory Visit (INDEPENDENT_AMBULATORY_CARE_PROVIDER_SITE_OTHER): Payer: PRIVATE HEALTH INSURANCE | Admitting: Gastroenterology

## 2018-08-22 ENCOUNTER — Encounter (INDEPENDENT_AMBULATORY_CARE_PROVIDER_SITE_OTHER): Payer: Self-pay

## 2018-08-22 ENCOUNTER — Telehealth: Payer: Self-pay

## 2018-08-22 ENCOUNTER — Encounter: Payer: Self-pay | Admitting: Gastroenterology

## 2018-08-22 VITALS — BP 122/70 | HR 68 | Ht 72.0 in | Wt 240.0 lb

## 2018-08-22 DIAGNOSIS — R0789 Other chest pain: Secondary | ICD-10-CM

## 2018-08-22 DIAGNOSIS — K219 Gastro-esophageal reflux disease without esophagitis: Secondary | ICD-10-CM

## 2018-08-22 MED ORDER — DEXLANSOPRAZOLE 60 MG PO CPDR: 60.0000 mg | DELAYED_RELEASE_CAPSULE | Freq: Every day | ORAL | 11 refills | Status: DC

## 2018-08-22 MED ORDER — FAMOTIDINE 20 MG PO TABS: 20.0000 mg | ORAL_TABLET | Freq: Every day | ORAL | 11 refills | Status: DC

## 2018-08-22 NOTE — Patient Instructions (Addendum)
If you are age 51 or older, your body mass index should be between 23-30. Your Body mass index is 32.55 kg/m. If this is out of the aforementioned range listed, please consider follow up with your Primary Care Provider.  If you are age 66 or younger, your body mass index should be between 19-25. Your Body mass index is 32.55 kg/m. If this is out of the aformentioned range listed, please consider follow up with your Primary Care Provider.   We have sent the following medications to your pharmacy for you to pick up at your convenience: Dexilant once daily Pepcid 20 mg at bedtime.   We are sending a Cardiac Clearance to Dr. Bettina Gavia to see if it is safe to proceed with your procedure we will contact you when we receive this. If you do not hear from [place] in 2 weeks please contact our office in 2 weeks.   Hold Protonix   Thank you,  Dr. Jackquline Denmark

## 2018-08-22 NOTE — Progress Notes (Signed)
Chief Complaint: Gastroesophageal reflux  Referring Provider:  Angelina Sheriff, MD      ASSESSMENT AND PLAN;   #1. GERD despite Protonix/Zantac.  #2. NCCP. Pt with H/O CAD s/p PCI/DES to LAD 03/2018 on ASA/plavix (followed by Dr. Bettina Gavia), HTN, HLD. Neg CT abdo/pevis 09/2016.  #3.  Recently diagnosed with sleep apnea on CPAP.  Plan: - Dexilant 60mg  po qd (samples given).  If he does better, we will continue Dexilant. - Pepcid 40mg  po qhs. - Hold off protonix. - EGD if OK with Dr Bettina Gavia to hold plavix 5 days before, can continue aspirin (had stent 03/2018, may not be a candidate yet). If not a candiate for EGD- will consider UGI series.  HPI:    Aaron Reese is a 53 y.o. male  Accompanied by his wife Seen at request of Dr. Lin Landsman. Has been having significant reflux symptoms including heartburn, regurgitation especially at night and in the morning without any odynophagia or dysphagia. This is despite Protonix and Zantac. No melena or hematochezia  Also has been having substernal chest pains.  Has been told that he has noncardiac chest pains.  Advised GI evaluation.  Is followed by pulmonary.  He had positive sleep study and is currently on CPAP.  Also being evaluated for questionable ILD  Wants to get EGD performed as per Dr. Janace Aris recommendations.  History of coronary artery disease status post PCI/DES to LAD 04/08/2018, patient is currently on aspirin and Plavix.  Being followed by Dr. Bettina Gavia.  Denies having any diarrhea or constipation.  Past GI procedures: -Colonoscopy 11/23/2016-6 mm sigmoid polyp status post polypectomy, mild sigmoid diverticulosis. Bx- TA. Rpt 2023. -CT 10/08/2016- neg. Past Medical History:  Diagnosis Date  . Anxiety 03/24/2018  . Benign essential hypertension 03/24/2018  . CAD (coronary artery disease)    s/p DES to pLAD & mLAD 04/08/18  . Depression 03/24/2018  . Frequent headaches 03/24/2018  . GERD (gastroesophageal reflux disease)  03/24/2018  . Hypercholesterolemia 03/24/2018  . Hypotestosteronemia 03/24/2018  . Substernal chest pain 03/24/2018    Past Surgical History:  Procedure Laterality Date  . CARDIAC CATHETERIZATION    . COLONOSCOPY  11/23/2016   Colon polyps. Diverticulosis of colon. Internal hemorrhoids.   . CORONARY STENT INTERVENTION N/A 04/08/2018   Procedure: CORONARY STENT INTERVENTION;  Surgeon: Jettie Booze, MD;  Location: [place] [facility];  Service: Cardiovascular;  Laterality: N/A;  . LEFT HEART CATH AND CORONARY ANGIOGRAPHY N/A 04/08/2018   Procedure: LEFT HEART CATH AND CORONARY ANGIOGRAPHY;  Surgeon: Jettie Booze, MD;  Location: [place] [facility];  Service: Cardiovascular;  Laterality: N/A;    Family History  Problem Relation Age of Onset  . Hypertension Mother   . ALS Father   . Stroke Brother   . Heart disease Neg Hx   . Cancer Neg Hx   . Diabetes Neg Hx     Social History   Tobacco Use  . Smoking status: Never Smoker  . Smokeless tobacco: Never Used  Substance Use Topics  . Alcohol use: Not Currently    Frequency: Never  . Drug use: Not Currently    Current Outpatient Medications  Medication Sig Dispense Refill  . aspirin EC 81 MG tablet Take 1 tablet (81 mg total) by mouth daily. 90 tablet 3  . atorvastatin (LIPITOR) 80 MG tablet TAKE ONE TABLET BY MOUTH DAILY AT 6PM 30 tablet 2  . clopidogrel (PLAVIX) 75 MG tablet Take 1 tablet (75 mg total) by  mouth daily with breakfast. 30 tablet 6  . levocetirizine (XYZAL) 5 MG tablet Take 5 mg by mouth every evening.   1  . lisinopril (PRINIVIL,ZESTRIL) 10 MG tablet Take 10 mg by mouth every evening.   1  . metoprolol succinate (TOPROL-XL) 50 MG 24 hr tablet Take 1 tablet (50 mg total) by mouth daily. Take with or immediately following a meal. 30 tablet 2  . nitroGLYCERIN (NITROSTAT) 0.4 MG SL tablet Place 1 tablet (0.4 mg total) under the tongue every 5 (five) minutes as needed. 25 tablet 2  . pantoprazole (PROTONIX)  40 MG tablet Take 40 mg by mouth every evening.     . ranitidine (ZANTAC) 150 MG tablet Take 150 mg by mouth 1 day or 1 dose.     No current facility-administered medications for this visit.     Allergies  Allergen Reactions  . Citalopram Other (See Comments)    "ineffective"  . Sertraline Other (See Comments)    "ineffective"  . Venlafaxine Other (See Comments)    "limited efficacy"    Review of Systems:  Constitutional: Denies fever, chills, diaphoresis, appetite change and fatigue.  HEENT: Denies photophobia, eye pain, redness, hearing loss, ear pain, congestion, sore throat, rhinorrhea, sneezing, mouth sores, neck pain, neck stiffness and tinnitus.   Respiratory: Denies SOB, DOE, cough, chest tightness,  and wheezing.   Cardiovascular: Denies palpitations and leg swelling.  Has occasional chest pains Genitourinary: Denies dysuria, urgency, frequency, hematuria, flank pain and difficulty urinating.  Musculoskeletal: has myalgias, No back pain, joint swelling, arthralgias and gait problem.  Skin: No rash.  Neurological: Denies dizziness, seizures, syncope, weakness, light-headedness, numbness and headaches.  Hematological: Denies adenopathy. Easy bruising, personal or family bleeding history  Psychiatric/Behavioral: Has  anxiety or depression     Physical Exam:    BP 122/70   Pulse 68   Ht 6' (1.829 m)   Wt 240 lb (108.9 kg)   BMI 32.55 kg/m  Filed Weights   08/22/18 1430  Weight: 240 lb (108.9 kg)   Constitutional:  Well-developed, in no acute distress. Psychiatric: Normal mood and affect. Behavior is normal. HEENT: Pupils normal.  Conjunctivae are normal. No scleral icterus. Neck supple.  Cardiovascular: Normal rate, regular rhythm. No edema Pulmonary/chest: Effort normal and breath sounds normal. No wheezing, rales or rhonchi. Abdominal: Soft, nondistended. Nontender. Bowel sounds active throughout. There are no masses palpable. No hepatomegaly. Rectal:   defered Neurological: Alert and oriented to person place and time. Skin: Skin is warm and dry. No rashes noted.  Data Reviewed: I have personally reviewed following labs and imaging studies  CBC: CBC Latest Ref Rng & Units 04/09/2018 03/31/2018  WBC 4.0 - 10.5 K/uL 9.9 12.8(H)  Hemoglobin 13.0 - 17.0 g/dL 14.8 17.9(H)  Hematocrit 39.0 - 52.0 % 45.9 51.0  Platelets 150 - 400 K/uL 253 229    CMP: CMP Latest Ref Rng & Units 04/09/2018 03/31/2018  Glucose 70 - 99 mg/dL 135(H) 93  BUN 6 - 20 mg/dL 14 12  Creatinine 0.61 - 1.24 mg/dL 1.08 1.20  Sodium 135 - 145 mmol/L 137 140  Potassium 3.5 - 5.1 mmol/L 3.7 4.6  Chloride 98 - 111 mmol/L 106 100  CO2 22 - 32 mmol/L 23 25  Calcium 8.9 - 10.3 mg/dL 8.4(L) 9.6     Carmell Austria, MD 08/22/2018, 3:02 PM  Cc: Angelina Sheriff, MD

## 2018-08-22 NOTE — Telephone Encounter (Signed)
   Primary Cardiologist: Shirlee More, MD  Chart reviewed as part of pre-operative protocol coverage. Pt is s/p recent PCI + DES placement x 2 04/08/18. Recommendations are to treat w/ DAPT for a minimum of 6 months w/o interruption. This would not be until the end of April, but even at that time Dr. Bettina Gavia would still need to clear.   Based on GI office note from today, they outlined that if not a candidate for EGD then they would opt for UGI series.   Based on Pt's recent DES placement and need to continue DAPT w/o interruption, I would advise EGD be post postponed and noninvasive w/u be considered.   Pt is not allowed to stop ASA nor Plavix at this time.   I will route notification to requesting party and will remove from preop pool.    Please call with questions.  Lyda Jester, PA-C 08/22/2018, 3:38 PM

## 2018-08-22 NOTE — Telephone Encounter (Signed)
[facility] Pre-operative Risk Assessment     Request for surgical clearance:     Endoscopy Procedure  What type of surgery is being performed?     EGD  When is this surgery scheduled?     Not scheduled yet  What type of clearance is required ?   Pharmacy  Are there any medications that need to be held prior to surgery and how long? Plavix 5 days  Practice name and name of physician performing surgery?      [facility] Rajesh Gupta  What is your office phone and fax number?      Phone- [phone]  Fax- [phone]  Anesthesia type (None, local, MAC, general) ?       MAC 

## 2018-08-25 ENCOUNTER — Other Ambulatory Visit: Payer: Self-pay

## 2018-08-25 DIAGNOSIS — K219 Gastro-esophageal reflux disease without esophagitis: Secondary | ICD-10-CM

## 2018-08-25 DIAGNOSIS — R0789 Other chest pain: Secondary | ICD-10-CM

## 2018-08-25 NOTE — Progress Notes (Unsigned)
Per Dr. Lyndel Safe since patient can not come off of his Plavix patient needs a UGI Series this is scheduled at [facility] for 09/10/18 at 11am. Patient was notified.

## 2018-09-04 ENCOUNTER — Telehealth: Payer: Self-pay | Admitting: *Deleted

## 2018-09-04 NOTE — Telephone Encounter (Signed)
OK to reschedule x 4 weeks or longer until Covid scare is over [employer]

## 2018-09-04 NOTE — Telephone Encounter (Signed)
Patient has been scheduled for upper GI at [facility] on 09/10/18 for evaluation of reflux despite PPI. EGD not cleared by cardiology until at least April. Unfortunately, due to COVID-19 pandemic, [place] requests that all non-emergent procedures be cancelled or rescheduled at this time. Please advise as to whether this procedure can be rescheduled out by at least 4 weeks.Marland KitchenMarland KitchenMarland Kitchen

## 2018-09-05 ENCOUNTER — Telehealth: Payer: Self-pay | Admitting: Pulmonary Disease

## 2018-09-05 NOTE — Telephone Encounter (Signed)
Left voicemail for patient to call back. 

## 2018-09-05 NOTE — Telephone Encounter (Signed)
Patient has already rescheduled to 10/08/2018 appointment.

## 2018-09-05 NOTE — Telephone Encounter (Signed)
Called and spoke with pt in regards to him needing to cancel his OV in April due to not feeling well. I stated to pt that we could move the appt up sooner and do a televisit where one of our providers will call pt and do the office visit over the phone with him to see what all has been going on and to see if he needs any meds, cpap supplies, etc. Pt expressed understanding and stated he would like to do the televisit. Pt's appt has been changed with TP Monday, 3/23 at 9am for the televisit. Nothing further needed.

## 2018-09-08 ENCOUNTER — Ambulatory Visit (INDEPENDENT_AMBULATORY_CARE_PROVIDER_SITE_OTHER): Payer: PRIVATE HEALTH INSURANCE | Admitting: Adult Health

## 2018-09-08 ENCOUNTER — Encounter: Payer: Self-pay | Admitting: Adult Health

## 2018-09-08 DIAGNOSIS — R0602 Shortness of breath: Secondary | ICD-10-CM | POA: Diagnosis not present

## 2018-09-08 DIAGNOSIS — R9389 Abnormal findings on diagnostic imaging of other specified body structures: Secondary | ICD-10-CM

## 2018-09-08 DIAGNOSIS — G4733 Obstructive sleep apnea (adult) (pediatric): Secondary | ICD-10-CM | POA: Diagnosis not present

## 2018-09-08 NOTE — Progress Notes (Signed)
Reviewed, agree 

## 2018-09-08 NOTE — Patient Instructions (Signed)
Continue on CPAP at bedtime Keep up the good work Work on [employer] Do not drive if sleepy Follow-up in 6 to 8 weeks with pulmonary function testing CT chest is planned in April, you may receive a phone call if this needs to be rescheduled

## 2018-09-08 NOTE — Progress Notes (Signed)
@Patient  ID: Aaron Reese, male    DOB: 1965/09/20, 53 y.o.   MRN: [id number]  Chief Complaint  Patient presents with  . Follow-up    TELEVISIT - OSA     Referring provider: Angelina Sheriff, MD  HPI: Virtual Visit via Telephone Note  I connected with Aaron Reese on 09/08/18 at  9:00 AM EDT by telephone and verified that I am speaking with the correct person using two identifiers. (DOB [name])    I discussed the limitations, risks, security and privacy concerns of performing an evaluation and management service by telephone and the availability of in person appointments. I also discussed with the patient that there may be a patient responsible charge related to this service. The patient expressed understanding and agreed to proceed.   History of Present Illness: 53 year old male never smoker seen for pulmonary consult April 15, 2018 for abnormal CT chest and dyspnea. He had daytime sleepiness , was set up for sleep study .  He was found to have moderate sleep apnea with an split-night sleep study showing AHI 24/hour.  He was started on CPAP at bedtime.  Patient says that he is doing well with CPAP.  He does feel that his daytime sleepiness has decreased.  He is wearing his machine every night.  Download over the last 30 days shows excellent compliance.  With average daily usage around 6 hours.  Patient is on CPAP 12 cm H2O.  AHI 2.6.  Patient was set up for a follow-up CT chest which is planned for next month.  He also was set up for PFTs that have not been completed.  We discussed this with patient and he will have these done on his return visit in 6 to 8 weeks.  He says he continues to have some intermittent shortness of breath and tightness is been going on greater than 6 months.  He denies any chest pain.  Medical history significant for coronary disease status post PCI October 2019   Observations/Objective: October 2019 CT chest images independently reviewed showing normal  pulmonary parenchyma in the upper lobes but in the bases there is atelectasis and some airway thickening with surrounding groundglass.  Compared to in April 2018 CT scan of his abdomen this does appear to be progressive.  Split-night sleep study January 2020 AHI 24.3 with optimal control on CPAP 12 cm H2O.  Assessment and Plan: 1.  Obstructive sleep apnea-moderate well-controlled on CPAP.  CPAP download was reviewed with patient.  Patient seems to be doing well beginning his CPAP.  Continue on CPAP at bedtime.  Patient work on a healthy weight.  He is not to drive if sleepy. We will follow back in 6 to 8 weeks for a follow-up visit.  2.  Abnormal CT chest.  Patient has an upcoming CT chest in April.  Advised patient this may be pushed back to May depending on radiology protocols during COVID 19   3.  Shortness of breath-patient will have PFTs completed on return visit in 6 to 8 weeks   Follow Up Instructions:    I discussed the assessment and treatment plan with the patient. The patient was provided an opportunity to ask questions and all were answered. The patient agreed with the plan and demonstrated an understanding of the instructions.   The patient was advised to call back or seek an in-person evaluation if the symptoms worsen or if the condition fails to improve as anticipated.  I provided 22 minutes of non-face-to-face  time during this encounter.   Rexene Edison, NP      Allergies  Allergen Reactions  . Citalopram Other (See Comments)    "ineffective"  . Sertraline Other (See Comments)    "ineffective"  . Venlafaxine Other (See Comments)    "limited efficacy"     There is no immunization history on file for this patient.  Past Medical History:  Diagnosis Date  . Anxiety 03/24/2018  . Benign essential hypertension 03/24/2018  . CAD (coronary artery disease)    s/p DES to pLAD & mLAD 04/08/18  . Depression 03/24/2018  . Frequent headaches 03/24/2018  . GERD  (gastroesophageal reflux disease) 03/24/2018  . Hypercholesterolemia 03/24/2018  . Hypotestosteronemia 03/24/2018  . Substernal chest pain 03/24/2018    Tobacco History: Social History   Tobacco Use  Smoking Status Never Smoker  Smokeless Tobacco Never Used   Counseling given: Not Answered   Outpatient Medications Prior to Visit  Medication Sig Dispense Refill  . aspirin EC 81 MG tablet Take 1 tablet (81 mg total) by mouth daily. 90 tablet 3  . atorvastatin (LIPITOR) 80 MG tablet TAKE ONE TABLET BY MOUTH DAILY AT 6PM 30 tablet 2  . clopidogrel (PLAVIX) 75 MG tablet Take 1 tablet (75 mg total) by mouth daily with breakfast. 30 tablet 6  . dexlansoprazole (DEXILANT) 60 MG capsule Take 1 capsule (60 mg total) by mouth daily. 30 capsule 11  . famotidine (PEPCID) 20 MG tablet Take 1 tablet (20 mg total) by mouth at bedtime. 30 tablet 11  . levocetirizine (XYZAL) 5 MG tablet Take 5 mg by mouth every evening.   1  . lisinopril (PRINIVIL,ZESTRIL) 10 MG tablet Take 10 mg by mouth every evening.   1  . metoprolol succinate (TOPROL-XL) 50 MG 24 hr tablet Take 1 tablet (50 mg total) by mouth daily. Take with or immediately following a meal. 30 tablet 2  . nitroGLYCERIN (NITROSTAT) 0.4 MG SL tablet Place 1 tablet (0.4 mg total) under the tongue every 5 (five) minutes as needed. 25 tablet 2  . pantoprazole (PROTONIX) 40 MG tablet Take 40 mg by mouth every evening.     . ranitidine (ZANTAC) 150 MG tablet Take 150 mg by mouth 1 day or 1 dose.     No facility-administered medications prior to visit.       Rexene Edison, NP 09/08/2018

## 2018-09-10 ENCOUNTER — Ambulatory Visit (HOSPITAL_COMMUNITY): Payer: PRIVATE HEALTH INSURANCE

## 2018-09-29 ENCOUNTER — Other Ambulatory Visit: Payer: Self-pay | Admitting: Cardiology

## 2018-09-30 ENCOUNTER — Ambulatory Visit: Payer: PRIVATE HEALTH INSURANCE | Admitting: Pulmonary Disease

## 2018-10-07 ENCOUNTER — Telehealth: Payer: Self-pay

## 2018-10-07 NOTE — Telephone Encounter (Signed)
Made appt with BM for 930am on 10/08/18.  Will forward to [facility] for reference.

## 2018-10-07 NOTE — Telephone Encounter (Signed)
Aaron Reese [facility] would like to discuss with TP the CT scan.  [name] phone number is [phone].

## 2018-10-07 NOTE — Telephone Encounter (Signed)
-----   Message from Melvenia Needles, NP sent at 10/07/2018 12:18 PM EDT ----- Regarding: RE: CT Chest due in April 2020 What did the chest xray show ??    ----- Message ----- From: Vivia Ewing, LPN Sent: 3/87/5643  11:59 AM EDT To: Melvenia Needles, NP Subject: FW: CT Chest due in April 2020                 Please advise.  ----- Message ----- From: Catha Gosselin Sent: 10/07/2018  11:56 AM EDT To: Vivia Ewing, LPN Subject: RE: CT Chest due in April 2020                 Called patient to advise that due to COVID-19 CT Chest to be schedule in May/June. Pt stated that he is still having issues and that he went to get a CXR today and also went to see primary care.  Pt is requesting to have CT in May if this is ok. Suanne Marker ----- Message ----- From: Vivia Ewing, LPN Sent: 09/14/5186  11:41 AM EDT To: Catha Gosselin Subject: FW: CT Chest due in April 2020                 If you need anything else please let me know.  ----- Message ----- From: Melvenia Needles, NP Sent: 10/07/2018  11:20 AM EDT To: Vivia Ewing, LPN Subject: RE: CT Chest due in April 2020                 This can be pushed back 1-2 months   Tp  ----- Message ----- From: Vivia Ewing, LPN Sent: 10/01/6061   9:41 AM EDT To: Melvenia Needles, NP Subject: FW: CT Chest due in April 2020                 Please advise. Thanks.  ----- Message ----- From: Catha Gosselin Sent: 10/07/2018   8:52 AM EDT To: Vivia Ewing, LPN Subject: CT Chest due in April 2020                     Pt is due to have CT Chest in April 2020.  Does patient need scan in April or can this be moved out due to COVID-19 protocol?  Please advise.    Thank you! Suanne Marker

## 2018-10-07 NOTE — Progress Notes (Signed)
Virtual Visit via Telephone Note  I connected with Aaron Reese on 10/08/18 at  9:30 AM EDT by telephone and verified that I am speaking with the correct person using two identifiers.   I discussed the limitations, risks, security and privacy concerns of performing an evaluation and management service by telephone and the availability of in person appointments. I also discussed with the patient that there may be a patient responsible charge related to this service. The patient expressed understanding and agreed to proceed.   History of Present Illness: 53 year old male never smoker followed in our office for obstructive sleep apnea and dyspnea on exertion Pt of Dr. Lake Bells   Patient consented to consult via telephone: Yes People present and their role in pt care: Pt   Chief complaint: OSA / SOB  53 year old male never smoker followed in our office for moderate struct of sleep apnea as well as dyspnea on exertion.  Patient was seen by Dr. Lake Bells in October/2019.  At that time patient had exposure to chlorine gas.  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  Patient was then ordered to have a pulmonary function test which patient still has not completed.  Patient reports that his work environment has improved.  He reports that they have added more events and filters as well as different mask for him to use.  He reports these changes were just made recently though.  Patient continues to report chest pain for the last 3 months.  He has a dull ache which does get more intense with physical activity.  He reports that this is a pretty consistent pain although he does have days where sometimes it feels like it lessens.  He has not notified cardiology regarding these results.  He is on Plavix.  He has known CAD.  He has an upcoming appointment with cardiology next week.  Patient reports that his GERD is well controlled with his new medications with Dr. group to put him on.   Patient was initially scheduled to have an endoscopy but this was canceled due to the fact that cardiology did not want the patient to come off of Plavix.  Right now they are debating endoscopy versus barium swallow eval.  GI feels that the barium swallow eval would not be as good of an assessment per patient. Recent changes at work   Patient reports that his breathing has been relatively stable.  He continues to have some dyspnea on exertion.  He denies productive cough with sputum.  He denies body aches chills and fevers.  He does not feel he has had increased shortness of breath.  He has had routine blood work done with his primary care last week which he did have an elevated white blood cell count around 11 per patient.  I cannot view these results as his PCP is in [place] and is not in epic.  Patient presented to primary care yesterday and they performed a chest x-ray which results are listed below:  10/07/2018-chest x-ray ([facility])- chronic appearing lingular opacity, no acute infiltrate edema mass or effusion otherwise, chronic elevation of right hemidiaphragm, probable chronic lingular scarring however given slight increase in conspicuity compared to prior study dated on 01/24/2018 recommend chest CT without contrast.  Patient also has moderate obstructive sleep apnea.  He reports he has been using his CPAP and finds benefit from it.  He reports that over the last week to 2 weeks he has had more issues with his  full facemask.  He is interested in trying a nasal mask.  He reports that he used a nasal mask when he did his split-night sleep study and found that that was better.  The DME company convince him to use a fullface mask.  CPAP compliance report listed below shows above average compliance with room for improvement on average usage use.  Well-controlled AHI.  09/07/2018-10/06/2018-CPAP compliance report-27 out of last 30 days used, 16 of those days greater than 4 hours, average usage  5 hours and 14 minutes, CPAP set pressure of 12, AHI 2.5    Observations/Objective:  October 2019 CT chest  showing normal pulmonary parenchyma in the upper lobes but in the bases there is atelectasis and some airway thickening with surrounding groundglass.  Compared to in April 2018 CT scan of his abdomen this does appear to be progressive.  Split-night sleep study January 2020 AHI 24.3 with optimal control on CPAP 12 cm H2O.  10/07/2018-chest x-ray ([facility])- chronic appearing lingular opacity, no acute infiltrate edema mass or effusion otherwise, chronic elevation of right hemidiaphragm, probable chronic lingular scarring however given slight increase in conspicuity compared to prior study dated on 01/24/2018 recommend chest CT without contrast.   No results found for: NITRICOXIDE  Assessment and Plan:  OSA (obstructive sleep apnea) Assessment: Above average CPAP compliance Occasional leaks on CPAP compliance report Patient using full facemask and is interested in using nasal mask January/2020 split-night sleep study shows an AHI of 24.3  Plan: Continue CPAP therapy as prescribed We can further evaluate in 2 months and check your compliance Mask of choice at DME company  GERD (gastroesophageal reflux disease) Plan: Continue to follow-up with GI Continue GERD medication  Abnormal finding on lung imaging Assessment: April/2020 chest x-ray from primary care shows chronic appearing lingular opacity which is slightly increased, unfortunate unable to view these images going off of radiology report October/2019 CT chest showing some groundglass opacity as well as airway thickening Patient with known chlorine gas exposure Elevated WBC count last week  Plan: Proceed forward with CT chest without contrast   Chlorine gas exposure Assessment: Patient works at [employer] in [place] with known chlorine gas exposure Patient reporting that air filtration  system is as well as mask that he wears at work have helped with chlorine gas  Plan: Pulmonary function test in June when COVID-19 restrictions are lifted CT chest ordered Follow-up with our office in 2 months   Shortness of breath Plan: CT chest ordered Pulmonary function test in June/2020 Follow-up with Wyn Quaker in June/2020  Chest pain in adult Assessment: 2 to 3 months of ongoing chest pain Patient reports that is a dull consistent ache that sometimes worsens with physical activity Patient with known CAD and is managed on Plavix Patient has not followed up with primary care Questionable etiology is patient has known CAD, chlorine gas exposure, shortness of breath which is chronic with no PFTs, and known GERD Patient is not reporting significant or worsening shortness of breath at this time which leads me to believe this may be more cardiac driven  Plan: Contact cardiology regarding your ongoing chest pain history of chronic CAD and should be updating them when you are having worsened symptoms Patient reports that he will contact cardiology today Keep scheduled follow-up with cardiology next week CT chest without contrast ordered to be completed    Follow Up Instructions:  Return in about 2 months (around 12/08/2018), or if symptoms worsen or fail to  improve, for Follow up with Dr. Lake Bells, Follow up with Wyn Quaker FNP-C, Follow up for PFT.    I discussed the assessment and treatment plan with the patient. The patient was provided an opportunity to ask questions and all were answered. The patient agreed with the plan and demonstrated an understanding of the instructions.   The patient was advised to call back or seek an in-person evaluation if the symptoms worsen or if the condition fails to improve as anticipated.  I provided 35 minutes of non-face-to-face time during this encounter.   Lauraine Rinne, NP

## 2018-10-07 NOTE — Telephone Encounter (Signed)
Call made to patient, he states he had his CXR at [facility] in [place]. I made him aware we needed a copy but I also made patient aware they would require a signed release of records. Informed patient to call us once he gets the results of his CT so we can discuss/update with TP. Voiced understanding. Will await a call back.

## 2018-10-07 NOTE — Telephone Encounter (Signed)
Spoke to PCP NP - please update in computer   She is faxing xray results to our office , please get and have ready for visit tomorrow .     Please call patient and discuss a televisit on 4/22 with Aaron Edelman to sort thru some of his issues,   1. Can decide if CT chest needs to be now or can be rescheduled  2. Ongoing resp symptoms  3. CPAP mask issues   Please contact office for sooner follow up if symptoms do not improve or worsen or seek emergency care

## 2018-10-08 ENCOUNTER — Other Ambulatory Visit: Payer: Self-pay

## 2018-10-08 ENCOUNTER — Ambulatory Visit (HOSPITAL_BASED_OUTPATIENT_CLINIC_OR_DEPARTMENT_OTHER)
Admission: RE | Admit: 2018-10-08 | Discharge: 2018-10-08 | Disposition: A | Discharge status: Home / Self Care | Payer: PRIVATE HEALTH INSURANCE | Source: Ambulatory Visit | Attending: Pulmonary Disease | Admitting: Pulmonary Disease

## 2018-10-08 ENCOUNTER — Ambulatory Visit (INDEPENDENT_AMBULATORY_CARE_PROVIDER_SITE_OTHER): Payer: PRIVATE HEALTH INSURANCE | Admitting: Pulmonary Disease

## 2018-10-08 ENCOUNTER — Encounter: Payer: Self-pay | Admitting: Pulmonary Disease

## 2018-10-08 ENCOUNTER — Ambulatory Visit (HOSPITAL_COMMUNITY): Payer: PRIVATE HEALTH INSURANCE

## 2018-10-08 DIAGNOSIS — R918 Other nonspecific abnormal finding of lung field: Secondary | ICD-10-CM | POA: Diagnosis not present

## 2018-10-08 DIAGNOSIS — Z77098 Contact with and (suspected) exposure to other hazardous, chiefly nonmedicinal, chemicals: Secondary | ICD-10-CM | POA: Insufficient documentation

## 2018-10-08 DIAGNOSIS — J9811 Atelectasis: Secondary | ICD-10-CM | POA: Diagnosis not present

## 2018-10-08 DIAGNOSIS — K219 Gastro-esophageal reflux disease without esophagitis: Secondary | ICD-10-CM | POA: Diagnosis not present

## 2018-10-08 DIAGNOSIS — G4733 Obstructive sleep apnea (adult) (pediatric): Secondary | ICD-10-CM

## 2018-10-08 DIAGNOSIS — R9389 Abnormal findings on diagnostic imaging of other specified body structures: Secondary | ICD-10-CM

## 2018-10-08 DIAGNOSIS — R0602 Shortness of breath: Secondary | ICD-10-CM | POA: Insufficient documentation

## 2018-10-08 DIAGNOSIS — R079 Chest pain, unspecified: Secondary | ICD-10-CM

## 2018-10-08 HISTORY — DX: Contact with and (suspected) exposure to other hazardous, chiefly nonmedicinal, chemicals: Z77.098

## 2018-10-08 HISTORY — DX: Shortness of breath: R06.02

## 2018-10-08 HISTORY — DX: Obstructive sleep apnea (adult) (pediatric): G47.33

## 2018-10-08 NOTE — Telephone Encounter (Signed)
These of all been addressed.  We have proceeded forward with CT of his chest.  Patient will adjust to a different mask for his OSA.  We will have follow-up in June/2020 with a PFT.  Nothing further is needed  Wyn Quaker, FNP

## 2018-10-08 NOTE — Assessment & Plan Note (Signed)
Plan: CT chest ordered Pulmonary function test in June/2020 Follow-up with Wyn Quaker in June/2020

## 2018-10-08 NOTE — Patient Instructions (Addendum)
Ordered CT Chest to evaluate chest xray from 10/07/2018  Call cardiology today to notify them of your chest pain you've had for 2-3 months   DME:  Mask of choice   2 month compliance report   We recommend that you continue using your CPAP daily >>>Keep up the hard work using your device >>> Goal should be wearing this for the entire night that you are sleeping, at least 4 to 6 hours  Remember:  . Do not drive or operate heavy machinery if tired or drowsy.  . Please notify the supply company and office if you are unable to use your device regularly due to missing supplies or machine being broken.  . Work on maintaining a healthy weight and following your recommended nutrition plan  . Maintain proper daily exercise and movement  . Maintaining proper use of your device can also help improve management of other chronic illnesses such as: Blood pressure, blood sugars, and weight management.   BiPAP/ CPAP Cleaning:  >>>Clean weekly, with Dawn soap, and bottle brush.  Set up to air dry.   Return in about 2 months (around 12/08/2018), or if symptoms worsen or fail to improve, for Follow up with Dr. Lake Bells, Follow up with Wyn Quaker FNP-C, Follow up for PFT.  Recall for Dr. Lake Bells for August 2020    [place] (COVID-19) Are you at risk?  Are you at risk for the Coronavirus (COVID-19)?  To be considered HIGH RISK for Coronavirus (COVID-19), you have to meet the following criteria:  . Traveled to [place], [place], [place], [place] or [place]; or in the [place] to [place], [place], [place], or [place]; and have fever, cough, and shortness of breath within the last 2 weeks of travel OR . Been in close contact with a person diagnosed with COVID-19 within the last 2 weeks and have fever, cough, and shortness of breath . IF YOU DO NOT MEET THESE CRITERIA, YOU ARE CONSIDERED LOW RISK FOR COVID-19.  What to do if you are HIGH RISK for COVID-19?  Marland Kitchen If you are having a medical  emergency, call 911. . Seek medical care right away. Before you go to a doctor's office, urgent care or emergency department, call ahead and tell them about your recent travel, contact with someone diagnosed with COVID-19, and your symptoms. You should receive instructions from your physician's office regarding next steps of care.  . When you arrive at healthcare provider, tell the healthcare staff immediately you have returned from visiting [place], [place], [place], [place] or [place]; or traveled in the [place] to [place], [place], [place], or [place]; in the last two weeks or you have been in close contact with a person diagnosed with COVID-19 in the last 2 weeks.   . Tell the health care staff about your symptoms: fever, cough and shortness of breath. . After you have been seen by a medical provider, you will be either: o Tested for (COVID-19) and discharged home on quarantine except to seek medical care if symptoms worsen, and asked to  - Stay home and avoid contact with others until you get your results (4-5 days)  - Avoid travel on public transportation if possible (such as bus, train, or airplane) or o Sent to the Emergency Department by EMS for evaluation, COVID-19 testing, and possible admission depending on your condition and test results.  What to do if you are LOW RISK for COVID-19?  Reduce your risk of any infection by  using the same precautions used for avoiding the common cold or flu:  Marland Kitchen Wash your hands often with soap and warm water for at least 20 seconds.  If soap and water are not readily available, use an alcohol-based hand sanitizer with at least 60% alcohol.  . If coughing or sneezing, cover your mouth and nose by coughing or sneezing into the elbow areas of your shirt or coat, into a tissue or into your sleeve (not your hands). . Avoid shaking hands with others and consider head nods or verbal greetings only. . Avoid touching your eyes, nose, or mouth with  unwashed hands.  . Avoid close contact with people who are sick. . Avoid places or events with large numbers of people in one location, like concerts or sporting events. . Carefully consider travel plans you have or are making. . If you are planning any travel outside or inside the [place], visit the CDC's Travelers' Health webpage for the latest health notices. . If you have some symptoms but not all symptoms, continue to monitor at home and seek medical attention if your symptoms worsen. . If you are having a medical emergency, call 911.   [place] / e-Visit: [email or web address]         [facility]: [facility]: [phone]                   [facility]: [phone]           It is flu season:   >>> Best ways to protect herself from the flu: Receive the yearly flu vaccine, practice good hand hygiene washing with soap and also using hand sanitizer when available, eat a nutritious meals, get adequate rest, hydrate appropriately   Please contact the office if your symptoms worsen or you have concerns that you are not improving.   Thank you for choosing [facility] for your healthcare, and for allowing [place] to partner with you on your healthcare journey. I am thankful to be able to provide care to you today.   Wyn Quaker FNP-C    Living With Sleep Apnea Sleep apnea is a condition in which breathing pauses or becomes shallow during sleep. Sleep apnea is most commonly caused by a collapsed or blocked airway. People with sleep apnea snore loudly and have times when they gasp and stop breathing for 10 seconds or more during sleep. This happens over and over during the night. This disrupts your sleep and keeps your body from getting the rest that it needs, which can cause tiredness and lack of energy (fatigue) during the  day. The breaks in breathing also interrupt the deep sleep that you need to feel rested. Even if you do not completely wake up from the gaps in breathing, your sleep may not be restful. You may also have a headache in the morning and low energy during the day, and you may feel anxious or depressed. How can sleep apnea affect me? Sleep apnea increases your chances of extreme tiredness during the day (daytime fatigue). It can also increase your risk for health conditions, such as:  Heart attack.  Stroke.  Diabetes.  Heart failure.  Irregular heartbeat.  High blood pressure. If you have daytime fatigue as a result of sleep apnea, you may be more likely to:  Perform poorly at school or work.  Fall asleep while driving.  Have difficulty with attention.  Develop  depression or anxiety.  Become severely overweight (obese).  Have sexual dysfunction. What actions can I take to manage sleep apnea? Sleep apnea treatment   If you were given a device to open your airway while you sleep, use it only as told by your health care provider. You may be given: ? An oral appliance. This is a custom-made mouthpiece that shifts your lower jaw forward. ? A continuous positive airway pressure (CPAP) device. This device blows air through a mask when you breathe out (exhale). ? A nasal expiratory positive airway pressure (EPAP) device. This device has valves that you put into each nostril. ? A bi-level positive airway pressure (BPAP) device. This device blows air through a mask when you breathe in (inhale) and breathe out (exhale).  You may need surgery if other treatments do not work for you. Sleep habits  Go to sleep and wake up at the same time every day. This helps set your internal clock (circadian rhythm) for sleeping. ? If you stay up later than usual, such as on weekends, try to get up in the morning within 2 hours of your normal wake time.  Try to get at least 7-9 hours of sleep each  night.  Stop computer, tablet, and mobile phone use a few hours before bedtime.  Do not take long naps during the day. If you nap, limit it to 30 minutes.  Have a relaxing bedtime routine. Reading or listening to music may relax you and help you sleep.  Use your bedroom only for sleep. ? Keep your television and computer out of your bedroom. ? Keep your bedroom cool, dark, and quiet. ? Use a supportive mattress and pillows.  Follow your health care provider's instructions for other changes to sleep habits. Nutrition  Do not eat heavy meals in the evening.  Do not have caffeine in the later part of the day. The effects of caffeine can last for more than 5 hours.  Follow your health care provider's or dietitian's instructions for any diet changes. Lifestyle      Do not drink alcohol before bedtime. Alcohol can cause you to fall asleep at first, but then it can cause you to wake up in the middle of the night and have trouble getting back to sleep.  Do not use any products that contain nicotine or tobacco, such as cigarettes and e-cigarettes. If you need help quitting, ask your health care provider. Medicines  Take over-the-counter and prescription medicines only as told by your health care provider.  Do not use over-the-counter sleep medicine. You can become dependent on this medicine, and it can make sleep apnea worse.  Do not use medicines, such as sedatives and narcotics, unless told by your health care provider. Activity  Exercise on most days, but avoid exercising in the evening. Exercising near bedtime can interfere with sleeping.  If possible, spend time outside every day. Natural light helps regulate your circadian rhythm. General information  Lose weight if you need to, and maintain a healthy weight.  Keep all follow-up visits as told by your health care provider. This is important.  If you are having surgery, make sure to tell your health care provider that you  have sleep apnea. You may need to bring your device with you. Where to find more information Learn more about sleep apnea and daytime fatigue from:  American Sleep Association: sleepassociation.[place]: sleepfoundation.org  National Heart, Lung, and Blood Institute: https://www.hartman-hill.biz/ Summary  Sleep apnea can cause  daytime fatigue and other serious health conditions.  Both sleep apnea and daytime fatigue can be bad for your health and well-being.  You may need to wear a device while sleeping to help keep your airway open.  If you are having surgery, make sure to tell your health care provider that you have sleep apnea. You may need to bring your device with you.  Making changes to sleep habits, diet, lifestyle, and activity can help you manage sleep apnea. This information is not intended to replace advice given to you by your health care provider. Make sure you discuss any questions you have with your health care provider. Document Released: 08/29/2017 Document Revised: 02/04/2018 Document Reviewed: 08/29/2017 Elsevier Interactive Patient Education  2019 [employer].    CPAP and BPAP Information CPAP and BPAP are methods of helping a person breathe with the use of air pressure. CPAP stands for "continuous positive airway pressure." BPAP stands for "bi-level positive airway pressure." In both methods, air is blown through your nose or mouth and into your air passages to help you breathe well. CPAP and BPAP use different amounts of pressure to blow air. With CPAP, the amount of pressure stays the same while you breathe in and out. With BPAP, the amount of pressure is increased when you breathe in (inhale) so that you can take larger breaths. Your health care provider will recommend whether CPAP or BPAP would be more helpful for you. Why are CPAP and BPAP treatments used? CPAP or BPAP can be helpful if you have:  Sleep apnea.  Chronic obstructive pulmonary disease  (COPD).  Heart failure.  Medical conditions that weaken the muscles of the chest including muscular dystrophy, or neurological diseases such as amyotrophic lateral sclerosis (ALS).  Other problems that cause breathing to be weak, abnormal, or difficult. CPAP is most commonly used for obstructive sleep apnea (OSA) to keep the airways from collapsing when the muscles relax during sleep. How is CPAP or BPAP administered? Both CPAP and BPAP are provided by a small machine with a flexible plastic tube that attaches to a plastic mask. You wear the mask. Air is blown through the mask into your nose or mouth. The amount of pressure that is used to blow the air can be adjusted on the machine. Your health care provider will determine the pressure setting that should be used based on your individual needs. When should CPAP or BPAP be used? In most cases, the mask only needs to be worn during sleep. Generally, the mask needs to be worn throughout the night and during any daytime naps. People with certain medical conditions may also need to wear the mask at other times when they are awake. Follow instructions from your health care provider about when to use the machine. What are some tips for using the mask?   Because the mask needs to be snug, some people feel trapped or closed-in (claustrophobic) when first using the mask. If you feel this way, you may need to get used to the mask. One way to do this is by holding the mask loosely over your nose or mouth and then gradually applying the mask more snugly. You can also gradually increase the amount of time that you use the mask.  Masks are available in various types and sizes. Some fit over your mouth and nose while others fit over just your nose. If your mask does not fit well, talk with your health care provider about getting a different one.  If  you are using a mask that fits over your nose and you tend to breathe through your mouth, a chin strap may be  applied to help keep your mouth closed.  The CPAP and BPAP machines have alarms that may sound if the mask comes off or develops a leak.  If you have trouble with the mask, it is very important that you talk with your health care provider about finding a way to make the mask easier to tolerate. Do not stop using the mask. Stopping the use of the mask could have a negative impact on your health. What are some tips for using the machine?  Place your CPAP or BPAP machine on a secure table or stand near an electrical outlet.  Know where the on/off switch is located on the machine.  Follow instructions from your health care provider about how to set the pressure on your machine and when you should use it.  Do not eat or drink while the CPAP or BPAP machine is on. Food or fluids could get pushed into your lungs by the pressure of the CPAP or BPAP.  Do not smoke. Tobacco smoke residue can damage the machine.  For home use, CPAP and BPAP machines can be rented or purchased through home health care companies. Many different brands of machines are available. Renting a machine before purchasing may help you find out which particular machine works well for you.  Keep the CPAP or BPAP machine and attachments clean. Ask your health care provider for specific instructions. Get help right away if:  You have redness or open areas around your nose or mouth where the mask fits.  You have trouble using the CPAP or BPAP machine.  You cannot tolerate wearing the CPAP or BPAP mask.  You have pain, discomfort, and bloating in your abdomen. Summary  CPAP and BPAP are methods of helping a person breathe with the use of air pressure.  Both CPAP and BPAP are provided by a small machine with a flexible plastic tube that attaches to a plastic mask.  If you have trouble with the mask, it is very important that you talk with your health care provider about finding a way to make the mask easier to tolerate. This  information is not intended to replace advice given to you by your health care provider. Make sure you discuss any questions you have with your health care provider. Document Released: 03/02/2004 Document Revised: 02/04/2018 Document Reviewed: 04/23/2016 Elsevier Interactive Patient Education  2019 [employer].

## 2018-10-08 NOTE — Telephone Encounter (Signed)
Thank you.  Will close message.

## 2018-10-08 NOTE — Assessment & Plan Note (Addendum)
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

## 2018-10-08 NOTE — Assessment & Plan Note (Signed)
Plan: Continue to follow-up with GI Continue GERD medication

## 2018-10-08 NOTE — Assessment & Plan Note (Signed)
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

## 2018-10-08 NOTE — Assessment & Plan Note (Signed)
Assessment: 2 to 3 months of ongoing chest pain Patient reports that is a dull consistent ache that sometimes worsens with physical activity Patient with known CAD and is managed on Plavix Patient has not followed up with primary care Questionable etiology is patient has known CAD, chlorine gas exposure, shortness of breath which is chronic with no PFTs, and known GERD Patient is not reporting significant or worsening shortness of breath at this time which leads me to believe this may be more cardiac driven  Plan: Contact cardiology regarding your ongoing chest pain history of chronic CAD and should be updating them when you are having worsened symptoms Patient reports that he will contact cardiology today Keep scheduled follow-up with cardiology next week CT chest without contrast ordered to be completed

## 2018-10-08 NOTE — Assessment & Plan Note (Signed)
Assessment: Above average CPAP compliance Occasional leaks on CPAP compliance report Patient using full facemask and is interested in using nasal mask January/2020 split-night sleep study shows an AHI of 24.3  Plan: Continue CPAP therapy as prescribed We can further evaluate in 2 months and check your compliance Mask of choice at [facility]

## 2018-10-09 NOTE — Progress Notes (Signed)
Reviewed, agree 

## 2018-10-10 ENCOUNTER — Inpatient Hospital Stay: Admission: RE | Admit: 2018-10-10 | Payer: PRIVATE HEALTH INSURANCE | Source: Ambulatory Visit

## 2018-10-15 ENCOUNTER — Telehealth: Payer: Self-pay | Admitting: *Deleted

## 2018-10-15 NOTE — Telephone Encounter (Signed)
Pt stated Aaron Reese called him. Please call back

## 2018-10-16 NOTE — Telephone Encounter (Signed)
YOUR CARDIOLOGY TEAM HAS ARRANGED FOR AN E-VISIT FOR YOUR APPOINTMENT - PLEASE REVIEW IMPORTANT INFORMATION BELOW SEVERAL DAYS PRIOR TO YOUR APPOINTMENT  Due to the recent COVID-19 pandemic, we are transitioning in-person office visits to tele-medicine visits in an effort to decrease unnecessary exposure to our patients, their families, and staff. These visits are billed to your insurance just like a normal visit is. We also encourage you to sign up for MyChart if you have not already done so. You will need a smartphone if possible. For patients that do not have this, we can still complete the visit using a regular telephone but do prefer a smartphone to enable video when possible. You may have a family member that lives with you that can help. If possible, we also ask that you have a blood pressure cuff and scale at home to measure your blood pressure, heart rate and weight prior to your scheduled appointment. Patients with clinical needs that need an in-person evaluation and testing will still be able to come to the office if absolutely necessary. If you have any questions, feel free to call our office.     YOUR PROVIDER WILL BE USING THE FOLLOWING PLATFORM TO COMPLETE YOUR VISIT: DoxyMe  . IF USING DOXIMITY or [email or web address] - The staff will give you instructions on receiving your link to join the meeting the day of your visit.      2-3 DAYS BEFORE YOUR APPOINTMENT  You will receive a telephone call from one of our [facility] team members - your caller ID may say "Unknown caller." If this is a video visit, we will walk you through how to get the video launched on your phone. We will remind you check your blood pressure, heart rate and weight prior to your scheduled appointment. If you have an Apple Watch or Kardia, please upload any pertinent ECG strips the day before or morning of your appointment to [facility]. Our staff will also make sure you have reviewed the consent and agree to move forward with your  scheduled tele-health visit.     THE DAY OF YOUR APPOINTMENT  Approximately 15 minutes prior to your scheduled appointment, you will receive a telephone call from one of [facility] team - your caller ID may say "Unknown caller."  Our staff will confirm medications, vital signs for the day and any symptoms you may be experiencing. Please have this information available prior to the time of visit start. It may also be helpful for you to have a pad of paper and pen handy for any instructions given during your visit. They will also walk you through joining the smartphone meeting if this is a video visit.    CONSENT FOR TELE-HEALTH VISIT - PLEASE REVIEW  I hereby voluntarily request, consent and authorize [facility] and its employed or contracted physicians, physician assistants, nurse practitioners or other licensed health care professionals (the Practitioner), to provide me with telemedicine health care services (the "Services") as deemed necessary by the treating Practitioner. I acknowledge and consent to receive the Services by the Practitioner via telemedicine. I understand that the telemedicine visit will involve communicating with the Practitioner through live audiovisual communication technology and the disclosure of certain medical information by electronic transmission. I acknowledge that I have been given the opportunity to request an in-person assessment or other available alternative prior to the telemedicine visit and am voluntarily participating in the telemedicine visit.  I understand that I have the right to withhold or withdraw my consent to the  use of telemedicine in the course of my care at any time, without affecting my right to future care or treatment, and that the Practitioner or I may terminate the telemedicine visit at any time. I understand that I have the right to inspect all information obtained and/or recorded in the course of the telemedicine visit and may receive copies of  available information for a reasonable fee.  I understand that some of the potential risks of receiving the Services via telemedicine include:  Marland Kitchen Delay or interruption in medical evaluation due to technological equipment failure or disruption; . Information transmitted may not be sufficient (e.g. poor resolution of images) to allow for appropriate medical decision making by the Practitioner; and/or  . In rare instances, security protocols could fail, causing a breach of personal health information.  Furthermore, I acknowledge that it is my responsibility to provide information about my medical history, conditions and care that is complete and accurate to the best of my ability. I acknowledge that Practitioner's advice, recommendations, and/or decision may be based on factors not within their control, such as incomplete or inaccurate data provided by me or distortions of diagnostic images or specimens that may result from electronic transmissions. I understand that the practice of medicine is not an exact science and that Practitioner makes no warranties or guarantees regarding treatment outcomes. I acknowledge that I will receive a copy of this consent concurrently upon execution via email to the email address I last provided but may also request a printed copy by calling the office of [facility].    I understand that my insurance will be billed for this visit.   I have read or had this consent read to me. . I understand the contents of this consent, which adequately explains the benefits and risks of the Services being provided via telemedicine.  . I have been provided ample opportunity to ask questions regarding this consent and the Services and have had my questions answered to my satisfaction. . I give my informed consent for the services to be provided through the use of telemedicine in my medical care  By participating in this telemedicine visit I agree to the above.  Patient gave verbal  consent to virtual visit with Dr Bettina Gavia on 10-20-18.

## 2018-10-18 NOTE — Progress Notes (Signed)
Virtual Visit via Video Note   This visit type was conducted due to national recommendations for restrictions regarding the COVID-19 Pandemic (e.g. social distancing) in an effort to limit this patient's exposure and mitigate transmission in our community.  Due to his co-morbid illnesses, this patient is at least at moderate risk for complications without adequate follow up.  This format is felt to be most appropriate for this patient at this time.  All issues noted in this document were discussed and addressed.  A limited physical exam was performed with this format.  Please refer to the patient's chart for his consent to telehealth for [facility].   Date:  10/18/2018   ID:  Aaron Reese, DOB 1965/12/18, MRN [id number]  Patient Location: Home Provider Location: Home  PCP:  Angelina Sheriff, MD  Cardiologist:  Shirlee More, MD  Electrophysiologist:  None   Evaluation Performed:  Follow-Up Visit  Chief Complaint:  CAD  History of Present Illness:   Aaron Reese is a 53 y.o. male with a hx of mild nonobstructive CAD in 2010 hypertension hyperlipidemia who developed a rapid progression in his anginal pattern class III and underwent coronary angiography. He underwent coronary angiography was found to have severe mid and proximal LAD stenosis and received 2 drug-eluting stents 04/08/18. He was last seen 04/14/18.  He is employed with the city of [employer] and has to do repetitive lifting.  In the last weeks he has had been having nonexertional vague point localized sternal discomfort he describes as sharp and pressure is a little bit positional unrelated to activities and relieved with rest and is just more nagging than anything else lasting for hours and days at a time and causes him to be concerned about his heart.  He has had no exertional chest pain shortness of breath palpitation or syncope.  He told me in the past he has had costo chondritis.  He has been seen by the  city clinic and he had recent lab work tells me his lipids were at target and will fax a copy to my office today.  We reviewed the options for evaluation of either direct referral to coronary angiography or stress myocardial perfusion study.  I think in a male population with known anatomy and atypical symptoms he should have a myocardial perfusion study done in view of his history I will place him on a nonsteroidal anti-inflammatory drug but I told him to give him quick relief and I will plan to see him in the office after his cardiac test.  If the symptoms become more typical he will contact us and direct referral for coronary angiography would be appropriate.  His blood pressure at follow-up in the city clinic in the last few weeks was normal he does not have a device at home.  Recent CT of the chest was unremarkable reviewed with the patient  The patient does not have symptoms concerning for COVID-19 infection (fever, chills, cough, or new shortness of breath).    Past Medical History:  Diagnosis Date  . Anxiety 03/24/2018  . Benign essential hypertension 03/24/2018  . CAD (coronary artery disease)    s/p DES to pLAD & mLAD 04/08/18  . Depression 03/24/2018  . Frequent headaches 03/24/2018  . GERD (gastroesophageal reflux disease) 03/24/2018  . Hypercholesterolemia 03/24/2018  . Hypotestosteronemia 03/24/2018  . Substernal chest pain 03/24/2018   Past Surgical History:  Procedure Laterality Date  . CARDIAC CATHETERIZATION    . COLONOSCOPY  11/23/2016  Colon polyps. Diverticulosis of colon. Internal hemorrhoids.   . CORONARY STENT INTERVENTION N/A 04/08/2018   Procedure: CORONARY STENT INTERVENTION;  Surgeon: Jettie Booze, MD;  Location: [facility];  Service: Cardiovascular;  Laterality: N/A;  . LEFT HEART CATH AND CORONARY ANGIOGRAPHY N/A 04/08/2018   Procedure: LEFT HEART CATH AND CORONARY ANGIOGRAPHY;  Surgeon: Jettie Booze, MD;  Location: [facility];   Service: Cardiovascular;  Laterality: N/A;     No outpatient medications have been marked as taking for the 10/20/18 encounter (Appointment) with Richardo Priest, MD.     Allergies:   Citalopram; Sertraline; and Venlafaxine   Social History   Tobacco Use  . Smoking status: Never Smoker  . Smokeless tobacco: Never Used  Substance Use Topics  . Alcohol use: Not Currently    Frequency: Never  . Drug use: Not Currently     Family Hx: The patient's family history includes ALS in his father; Hypertension in his mother; Stroke in his brother. There is no history of Heart disease, Cancer, or Diabetes.  ROS:   Please see the history of present illness.     All other systems reviewed and are negative.   Prior CV studies:   The following studies were reviewed today:  Study Result   CLINICAL DATA:  Chest pain for 2-3 months.  EXAM: CT CHEST WITHOUT CONTRAST TECHNIQUE: Multidetector CT imaging of the chest was performed following the standard protocol without IV contrast. COMPARISON:  04/02/2018 FINDINGS: Cardiovascular: Calcifications in the left anterior descending and left circumflex coronary arteries. Stent within the left anterior descending coronary artery. Heart is normal size. Aorta is normal caliber with scattered aortic calcifications. Mediastinum/Nodes: No mediastinal, hilar, or axillary adenopathy. Lungs/Pleura: Stable lingular density most compatible with scarring. No confluent opacities or effusions otherwise. Upper Abdomen: Imaging into the upper abdomen shows no acute findings. Musculoskeletal: Chest wall soft tissues are unremarkable. No acute bony abnormality. IMPRESSION: Lingular scarring.  Coronary artery disease, prior LAD stenting.     Labs/Other Tests and Data Reviewed:    EKG:  An ECG dated 04/09/18 was personally reviewed today and demonstrated:  [facility] and was normal  Recent Labs: 04/09/2018: BUN 14; Creatinine, Ser 1.08; Hemoglobin 14.8;  Platelets 253; Potassium 3.7; Sodium 137   Recent Lipid Panel No results found for: CHOL, TRIG, HDL, CHOLHDL, LDLCALC, LDLDIRECT  Wt Readings from Last 3 Encounters:  08/22/18 240 lb (108.9 kg)  06/23/18 230 lb (104.3 kg)  04/15/18 235 lb (106.6 kg)     Objective:    Vital Signs:  There were no vitals taken for this visit.   Constitutional, well-nourished well-developed in no acute distress Vital signs reviewed Eyes, conjunctiva and sclera are normal without pallor or icterus extraocular motions intact and normal there is no lid lag Respiratory, normal effort and excursion no audible wheezing without a stethoscope Cardiovascular, no neck vein distention or peripheral edema Skin, no rash skin lesion or ulceration of the extremities Neurologic, cranial nerves II to XII are grossly intact and the patient moves all 4 extremities Neuro/Psychiatric, judgment and thought processes are intact and coherent, alert and oriented x3, mood and affect appear normal.  ASSESSMENT & PLAN:    1. Coronary artery disease, he is having quite atypical symptoms continue current medical treatment including uninterrupted dual antiplatelet beta-blocker statin and after discussion and shared decision making to undergo a stress myocardial perfusion study treatment for presumed costochondritis and evaluation in the office afterwards.  If the symptoms are  more typical of direct referral to coronary angiography.  If he has evidence of left anterior descending coronary ischemia he will have referral to coronary angiography. 2. Hypertension stable I encouraged him to buy a device to check home blood pressure and continue treatment including ACE inhibitor will be able to assess his blood pressure and blood pressure response at the time of his perfusion study 3. Hyperlipidemia stable continue his high intensity statin await labs recently done at the clinic that will be faxed today or LDL less than 70 ideally less than 55.   Potentially may require second agent like Zetia.  COVID-19 Education: The signs and symptoms of COVID-19 were discussed with the patient and how to seek care for testing (follow up with PCP or arrange E-visit).  The importance of social distancing was discussed today.  Time:   Today, I have spent 30 minutes with the patient with telehealth technology discussing the above problems.     Medication Adjustments/Labs and Tests Ordered: Current medicines are reviewed at length with the patient today.  Concerns regarding medicines are outlined above.   Tests Ordered: No orders of the defined types were placed in this encounter.   Medication Changes: No orders of the defined types were placed in this encounter.   Disposition:  Follow up in a few week(s)  Signed, Shirlee More, MD  10/18/2018 12:51 PM    [facility]

## 2018-10-20 ENCOUNTER — Telehealth (INDEPENDENT_AMBULATORY_CARE_PROVIDER_SITE_OTHER): Payer: PRIVATE HEALTH INSURANCE | Admitting: Cardiology

## 2018-10-20 ENCOUNTER — Encounter: Payer: Self-pay | Admitting: Cardiology

## 2018-10-20 ENCOUNTER — Encounter: Payer: Self-pay | Admitting: *Deleted

## 2018-10-20 ENCOUNTER — Telehealth: Payer: Self-pay | Admitting: *Deleted

## 2018-10-20 ENCOUNTER — Other Ambulatory Visit: Payer: Self-pay

## 2018-10-20 VITALS — Ht 72.0 in | Wt 238.0 lb

## 2018-10-20 DIAGNOSIS — I25118 Atherosclerotic heart disease of native coronary artery with other forms of angina pectoris: Secondary | ICD-10-CM

## 2018-10-20 DIAGNOSIS — I10 Essential (primary) hypertension: Secondary | ICD-10-CM

## 2018-10-20 DIAGNOSIS — R079 Chest pain, unspecified: Secondary | ICD-10-CM

## 2018-10-20 DIAGNOSIS — E7800 Pure hypercholesterolemia, unspecified: Secondary | ICD-10-CM

## 2018-10-20 MED ORDER — CELECOXIB 100 MG PO CAPS: 100.0000 mg | ORAL_CAPSULE | Freq: Two times a day (BID) | ORAL | 0 refills | Status: DC

## 2018-10-20 NOTE — Telephone Encounter (Signed)
Telephone call to patient . Informed of Stress test appointment at [facility] office May 7,2020 at 1045. Patient verbalized understanding.

## 2018-10-20 NOTE — Addendum Note (Signed)
Addended by: Particia Nearing B on: 10/20/2018 12:04 PM   Modules accepted: Orders

## 2018-10-20 NOTE — Patient Instructions (Signed)
Medication Instructions:  Your physician has recommended you make the following change in your medication: START: CELEBREX 100mg  (1 tab) twice daily for 14 days  If you need a refill on your cardiac medications before your next appointment, please call your pharmacy.   Lab work: None If you have labs (blood work) drawn today and your tests are completely normal, you will receive your results only by: Marland Kitchen MyChart Message (if you have MyChart) OR . A paper copy in the mail If you have any lab test that is abnormal or we need to change your treatment, we will call you to review the results.  Testing/Procedures: Your physician has requested that you have en exercise stress myoview. For further information please visit [email or web address]. Please follow instruction sheet, as given.   Follow-Up: At [facility], you and your health needs are our priority.  As part of our continuing mission to provide you with exceptional heart care, we have created designated Provider Care Teams.  These Care Teams include your primary Cardiologist (physician) and Advanced Practice Providers (APPs -  Physician Assistants and Nurse Practitioners) who all work together to provide you with the care you need, when you need it. .   Any Other Special Instructions Will Be Listed Below (If Applicable).

## 2018-10-23 ENCOUNTER — Ambulatory Visit (HOSPITAL_COMMUNITY): Payer: PRIVATE HEALTH INSURANCE | Attending: Cardiovascular Disease

## 2018-10-23 ENCOUNTER — Other Ambulatory Visit: Payer: Self-pay

## 2018-10-23 ENCOUNTER — Telehealth: Payer: Self-pay

## 2018-10-23 DIAGNOSIS — R079 Chest pain, unspecified: Secondary | ICD-10-CM | POA: Insufficient documentation

## 2018-10-23 DIAGNOSIS — R0602 Shortness of breath: Secondary | ICD-10-CM | POA: Insufficient documentation

## 2018-10-23 LAB — MYOCARDIAL PERFUSION IMAGING
LV dias vol: 81 mL (ref 62–150)
LV sys vol: 30 mL
Peak HR: 122 {beats}/min
Rest HR: 75 {beats}/min
SDS: 1
SRS: 0
SSS: 1
TID: 0.99

## 2018-10-23 MED ORDER — CELECOXIB 100 MG PO CAPS: 100.0000 mg | ORAL_CAPSULE | Freq: Two times a day (BID) | ORAL | 0 refills | Status: DC

## 2018-10-23 MED ORDER — TECHNETIUM TC 99M TETROFOSMIN IV KIT
8.7000 | PACK | Freq: Once | INTRAVENOUS | Status: AC | PRN
  Administered 2018-10-23: 8.7 via INTRAVENOUS
  Filled 2018-10-23: qty 9

## 2018-10-23 MED ORDER — TECHNETIUM TC 99M TETROFOSMIN IV KIT
26.2000 | PACK | Freq: Once | INTRAVENOUS | Status: AC | PRN
  Administered 2018-10-23: 26.2 via INTRAVENOUS
  Filled 2018-10-23: qty 27

## 2018-10-23 MED ORDER — REGADENOSON 0.4 MG/5ML IV SOLN
0.4000 mg | Freq: Once | INTRAVENOUS | Status: AC
  Administered 2018-10-23: 0.4 mg via INTRAVENOUS

## 2018-10-23 NOTE — Telephone Encounter (Signed)
-----   Message from Richardo Priest, MD sent at 10/23/2018  3:59 PM EDT ----- Normal or stable result  This is very reassuring study its normal.  I think that his chest pain is chest wall he can either take over-the-counter Aleve 1 daily or prescription Celebrex 100 mg twice daily for 2 weeks which ever he would prefer

## 2018-10-23 NOTE — Telephone Encounter (Signed)
Patient advised of myocardial perfusion study that was reviewed by Dr Bettina Gavia and he advised that it was normal.  Patient advised that this is chest wall pain and that he can OTC Aleve or Celebrex 100mg  twice daily for 2 weeks.   He had already been called in an Rx for 7 days, so we will send 7 more days #14 capsules to pharmacy.  Rx sent to pharmacy.  Patient agreed to plan and verbalized understanding.

## 2018-10-27 ENCOUNTER — Other Ambulatory Visit: Payer: Self-pay | Admitting: Cardiology

## 2018-10-27 NOTE — Telephone Encounter (Signed)
Rx refill sent to pharmacy. 

## 2018-11-03 ENCOUNTER — Ambulatory Visit: Payer: PRIVATE HEALTH INSURANCE

## 2018-11-03 ENCOUNTER — Ambulatory Visit: Payer: PRIVATE HEALTH INSURANCE | Admitting: Adult Health

## 2018-11-22 ENCOUNTER — Ambulatory Visit (HOSPITAL_COMMUNITY): Admission: RE | Admit: 2018-11-22 | Payer: PRIVATE HEALTH INSURANCE | Source: Ambulatory Visit

## 2018-11-22 ENCOUNTER — Other Ambulatory Visit (HOSPITAL_COMMUNITY): Payer: PRIVATE HEALTH INSURANCE

## 2018-12-02 ENCOUNTER — Other Ambulatory Visit: Payer: Self-pay | Admitting: Pulmonary Disease

## 2018-12-05 ENCOUNTER — Other Ambulatory Visit (HOSPITAL_COMMUNITY): Payer: PRIVATE HEALTH INSURANCE

## 2018-12-08 ENCOUNTER — Other Ambulatory Visit (HOSPITAL_COMMUNITY)
Admission: RE | Admit: 2018-12-08 | Discharge: 2018-12-08 | Disposition: A | Discharge status: Home / Self Care | Payer: PRIVATE HEALTH INSURANCE | Source: Ambulatory Visit | Attending: Pulmonary Disease | Admitting: Pulmonary Disease

## 2018-12-08 ENCOUNTER — Ambulatory Visit: Payer: PRIVATE HEALTH INSURANCE | Admitting: Pulmonary Disease

## 2018-12-08 DIAGNOSIS — Z1159 Encounter for screening for other viral diseases: Secondary | ICD-10-CM | POA: Diagnosis present

## 2018-12-08 LAB — SARS CORONAVIRUS 2 (TAT 6-24 HRS): SARS Coronavirus 2: NEGATIVE

## 2018-12-09 ENCOUNTER — Other Ambulatory Visit: Payer: Self-pay | Admitting: Pulmonary Disease

## 2018-12-09 DIAGNOSIS — R0602 Shortness of breath: Secondary | ICD-10-CM

## 2018-12-09 NOTE — Progress Notes (Signed)
SARS-CoV-2 negative.  This is good news.  Keep planned scheduled appointment with for pulmonary function test.  Wyn Quaker, FNP

## 2018-12-10 ENCOUNTER — Other Ambulatory Visit: Payer: Self-pay

## 2018-12-10 ENCOUNTER — Ambulatory Visit (INDEPENDENT_AMBULATORY_CARE_PROVIDER_SITE_OTHER): Payer: PRIVATE HEALTH INSURANCE | Admitting: Pulmonary Disease

## 2018-12-10 DIAGNOSIS — R0602 Shortness of breath: Secondary | ICD-10-CM

## 2018-12-10 LAB — PULMONARY FUNCTION TEST
DL/VA % pred: 108 %
DL/VA: 4.75 ml/min/mmHg/L
DLCO unc % pred: 94 %
DLCO unc: 28.64 ml/min/mmHg
FEF 25-75 Post: 5.85 L/sec
FEF 25-75 Pre: 4.19 L/sec
FEF2575-%Change-Post: 39 %
FEF2575-%Pred-Post: 165 %
FEF2575-%Pred-Pre: 118 %
FEV1-%Change-Post: 8 %
FEV1-%Pred-Post: 91 %
FEV1-%Pred-Pre: 84 %
FEV1-Post: 3.72 L
FEV1-Pre: 3.43 L
FEV1FVC-%Change-Post: 1 %
FEV1FVC-%Pred-Pre: 106 %
FEV6-%Change-Post: 6 %
FEV6-%Pred-Post: 87 %
FEV6-%Pred-Pre: 81 %
FEV6-Post: 4.42 L
FEV6-Pre: 4.13 L
FEV6FVC-%Change-Post: 0 %
FEV6FVC-%Pred-Post: 103 %
FEV6FVC-%Pred-Pre: 103 %
FVC-%Change-Post: 7 %
FVC-%Pred-Post: 84 %
FVC-%Pred-Pre: 79 %
FVC-Post: 4.45 L
FVC-Pre: 4.15 L
Post FEV1/FVC ratio: 84 %
Post FEV6/FVC ratio: 99 %
Pre FEV1/FVC ratio: 83 %
Pre FEV6/FVC Ratio: 100 %
RV % pred: 119 %
RV: 2.53 L
TLC % pred: 97 %
TLC: 7.05 L

## 2018-12-10 NOTE — Progress Notes (Signed)
PFT done today. 

## 2018-12-12 ENCOUNTER — Encounter: Payer: Self-pay | Admitting: Pulmonary Disease

## 2018-12-12 ENCOUNTER — Ambulatory Visit (INDEPENDENT_AMBULATORY_CARE_PROVIDER_SITE_OTHER): Payer: PRIVATE HEALTH INSURANCE | Admitting: Pulmonary Disease

## 2018-12-12 ENCOUNTER — Other Ambulatory Visit: Payer: Self-pay

## 2018-12-12 VITALS — BP 122/80 | HR 74 | Temp 97.9°F | Ht 72.0 in | Wt 241.9 lb

## 2018-12-12 DIAGNOSIS — R0602 Shortness of breath: Secondary | ICD-10-CM

## 2018-12-12 DIAGNOSIS — R918 Other nonspecific abnormal finding of lung field: Secondary | ICD-10-CM

## 2018-12-12 DIAGNOSIS — G4733 Obstructive sleep apnea (adult) (pediatric): Secondary | ICD-10-CM

## 2018-12-12 NOTE — Patient Instructions (Addendum)
Stable CT and PFT   Keep follow up with Cardiology    We recommend that you continue using your CPAP daily >>>Keep up the hard work using your device >>> Goal should be wearing this for the entire night that you are sleeping, at least 4 to 6 hours  Remember:   Do not drive or operate heavy machinery if tired or drowsy.   Please notify the supply company and office if you are unable to use your device regularly due to missing supplies or machine being broken.   Work on maintaining a healthy weight and following your recommended nutrition plan   Maintain proper daily exercise and movement   Maintaining proper use of your device can also help improve management of other chronic illnesses such as: Blood pressure, blood sugars, and weight management.   BiPAP/ CPAP Cleaning:  >>>Clean weekly, with Dawn soap, and bottle brush.  Set up to air dry.   Return in about 1 year (around 12/12/2019), or if symptoms worsen or fail to improve, for Follow up with Dr. Lake Bells.   Coronavirus (COVID-19) Are you at risk?  Are you at risk for the Coronavirus (COVID-19)?  To be considered HIGH RISK for Coronavirus (COVID-19), you have to meet the following criteria:  . Traveled to [place], [place], [place], [place] or [place]; or in the [place] to [place], [place], [place], or [place]; and have fever, cough, and shortness of breath within the last 2 weeks of travel OR . Been in close contact with a person diagnosed with COVID-19 within the last 2 weeks and have fever, cough, and shortness of breath . IF YOU DO NOT MEET THESE CRITERIA, YOU ARE CONSIDERED LOW RISK FOR COVID-19.  What to do if you are HIGH RISK for COVID-19?  Marland Kitchen If you are having a medical emergency, call 911. . Seek medical care right away. Before you go to a doctor's office, urgent care or emergency department, call ahead and tell them about your recent travel, contact with someone diagnosed with COVID-19, and your  symptoms. You should receive instructions from your physician's office regarding next steps of care.  . When you arrive at healthcare provider, tell the healthcare staff immediately you have returned from visiting [place], [place], [place], [place] or [place]; or traveled in the [place] to [place], [place], [place], or [place]; in the last two weeks or you have been in close contact with a person diagnosed with COVID-19 in the last 2 weeks.   . Tell the health care staff about your symptoms: fever, cough and shortness of breath. . After you have been seen by a medical provider, you will be either: o Tested for (COVID-19) and discharged home on quarantine except to seek medical care if symptoms worsen, and asked to  - Stay home and avoid contact with others until you get your results (4-5 days)  - Avoid travel on public transportation if possible (such as bus, train, or airplane) or o Sent to the Emergency Department by EMS for evaluation, COVID-19 testing, and possible admission depending on your condition and test results.  What to do if you are LOW RISK for COVID-19?  Reduce your risk of any infection by using the same precautions used for avoiding the common cold or flu:  Marland Kitchen Wash your hands often with soap and warm water for at least 20 seconds.  If soap and water are not readily available, use an alcohol-based hand sanitizer with at least  60% alcohol.  . If coughing or sneezing, cover your mouth and nose by coughing or sneezing into the elbow areas of your shirt or coat, into a tissue or into your sleeve (not your hands). . Avoid shaking hands with others and consider head nods or verbal greetings only. . Avoid touching your eyes, nose, or mouth with unwashed hands.  . Avoid close contact with people who are sick. . Avoid places or events with large numbers of people in one location, like concerts or sporting events. . Carefully consider travel plans you have or are making. . If you  are planning any travel outside or inside the [place], visit the CDC's Travelers' Health webpage for the latest health notices. . If you have some symptoms but not all symptoms, continue to monitor at home and seek medical attention if your symptoms worsen. . If you are having a medical emergency, call 911.   [place] / e-Visit: [email or web address]         [facility]: [facility]: [phone]                   [facility]: [phone]           It is flu season:   >>> Best ways to protect herself from the flu: Receive the yearly flu vaccine, practice good hand hygiene washing with soap and also using hand sanitizer when available, eat a nutritious meals, get adequate rest, hydrate appropriately   Please contact the office if your symptoms worsen or you have concerns that you are not improving.   Thank you for choosing [facility] for your healthcare, and for allowing [place] to partner with you on your healthcare journey. I am thankful to be able to provide care to you today.   Wyn Quaker FNP-C

## 2018-12-12 NOTE — Progress Notes (Signed)
@Patient  ID: Aaron Reese, male    DOB: Jun 05, 1966, 53 y.o.   MRN: [id number]  Chief Complaint  Patient presents with  . Follow-up    ct results, pft results    Referring provider: Angelina Sheriff, MD  HPI:  53 year old male followed in our office for obstructive sleep apnea and dyspnea on exertion  PMH: Anxiety, chest pain, depression, hypertension, GERD Smoker/ Smoking History: Never Smoker Maintenance: None Pt of: Dr. Lake Bells  12/12/2018  - Visit   53 year old male never smoker followed in our office for dyspnea on exertion as well as obstructive sleep apnea.  Patient was diagnosed with moderate obstructive sleep apnea in January/2020.  Patient has been managed on CPAP.  CPAP compliance report today shows excellent compliance.  See results below:  10/21/2018-11/19/2018 20-30 last 30 days use, all 30 those days greater than 4 hours, average usage 7 hours and 51 minutes, CPAP set pressure 12, AHI 1.9  Patient is also recently completed pulmonary function testing as well as a CT of his chest to further evaluate an abnormal chest x-ray that he had in [facility]  See those results listed below:  12/10/2018-pulmonary function test- FVC 4.15 (79% predicted), postbronchodilator ratio 84, postbronchodilator FEV1 3.72 (91% predicted), no bronchodilator response, slight mid flow reversibility, DLCO 94  10/08/2018-CT chest without contrast- stable lingular density most compatible with scarring no confluent opacities or effusions otherwise  Overall patient reports that he feels he has been doing well.  Patient is also completed follow-up with cardiology where he had a myocardial perfusion scan which was normal.  Patient still has been having some occasional bouts of atypical chest pain.  Cardiology is recommended the patient use Aleve or Celebrex to help with this.  He reports that this does help.  Overall patient feels that he is out of shape and is working to increase his physical  activity.     Tests:   October 2019 CT chest  showing normal pulmonary parenchyma in the upper lobes but in the bases there is atelectasis and some airway thickening with surrounding groundglass.  Compared to in April 2018 CT scan of his abdomen this does appear to be progressive.  Split-night sleep study January 2020 AHI 24.3 with optimal control on CPAP 12 cm H2O.  10/07/2018-chest x-ray ([facility])- chronic appearing lingular opacity, no acute infiltrate edema mass or effusion otherwise, chronic elevation of right hemidiaphragm, probable chronic lingular scarring however given slight increase in conspicuity compared to prior study dated on 01/24/2018 recommend chest CT without contrast.  10/08/2018-CT chest without contrast- lingular scarring, no confluent opacities or effusions otherwise  12/10/2018-pulmonary function test- FVC 4.15 (79% predicted), postbronchodilator ratio 84, postbronchodilator FEV1 3.72 (91% predicted), no bronchodilator response, mid flow reversibility, DLCO 94  10/23/2018-myocardial perfusion imaging-normal or stable result  FENO:  No results found for: NITRICOXIDE  PFT: PFT Results Latest Ref Rng & Units 12/10/2018  FVC-Pre L 4.15  FVC-Predicted Pre % 79  FVC-Post L 4.45  FVC-Predicted Post % 84  Pre FEV1/FVC % % 83  Post FEV1/FCV % % 84  FEV1-Pre L 3.43  FEV1-Predicted Pre % 84  FEV1-Post L 3.72  DLCO UNC% % 94  DLCO COR %Predicted % 108  TLC L 7.05  TLC % Predicted % 97  RV % Predicted % 119    Imaging: No results found.    Specialty Problems      Pulmonary Problems   OSA (obstructive sleep apnea)    Split-night sleep  study January 2020 AHI 24.3 with optimal control on CPAP 12 cm H2O.       Shortness of breath    10/08/2018-CT chest without contrast- lingular scarring, no confluent opacities or effusions otherwise  12/10/2018-pulmonary function test- FVC 4.15 (79% predicted), postbronchodilator ratio 84, postbronchodilator FEV1  3.72 (91% predicted), no bronchodilator response, mid flow reversibility, DLCO 94         Allergies  Allergen Reactions  . Citalopram Other (See Comments)    "ineffective"  . Sertraline Other (See Comments)    "ineffective"  . Venlafaxine Other (See Comments)    "limited efficacy"     There is no immunization history on file for this patient.  Past Medical History:  Diagnosis Date  . Anxiety 03/24/2018  . Benign essential hypertension 03/24/2018  . CAD (coronary artery disease)    s/p DES to pLAD & mLAD 04/08/18  . Depression 03/24/2018  . Frequent headaches 03/24/2018  . GERD (gastroesophageal reflux disease) 03/24/2018  . Hypercholesterolemia 03/24/2018  . Hypotestosteronemia 03/24/2018  . Substernal chest pain 03/24/2018    Tobacco History: Social History   Tobacco Use  Smoking Status Never Smoker  Smokeless Tobacco Never Used   Counseling given: Not Answered   Continue to not smoke  Outpatient Encounter Medications as of 12/12/2018  Medication Sig  . aspirin EC 81 MG tablet Take 1 tablet (81 mg total) by mouth daily.  Marland Kitchen atorvastatin (LIPITOR) 80 MG tablet TAKE ONE TABLET BY MOUTH DAILY AT 6PM  . celecoxib (CELEBREX) 100 MG capsule Take 1 capsule (100 mg total) by mouth 2 (two) times daily.  . clopidogrel (PLAVIX) 75 MG tablet TAKE ONE TABLET BY MOUTH DAILY WITH BREAKFAST  . dexlansoprazole (DEXILANT) 60 MG capsule Take 1 capsule (60 mg total) by mouth daily.  . DULoxetine (CYMBALTA) 60 MG capsule Take 60 mg by mouth daily.  Marland Kitchen levocetirizine (XYZAL) 5 MG tablet Take 5 mg by mouth every evening.   Marland Kitchen lisinopril (PRINIVIL,ZESTRIL) 10 MG tablet Take 10 mg by mouth every evening.   . metoprolol succinate (TOPROL-XL) 50 MG 24 hr tablet Take 1 tablet (50 mg total) by mouth daily. Take with or immediately following a meal.  . nitroGLYCERIN (NITROSTAT) 0.4 MG SL tablet Place 1 tablet (0.4 mg total) under the tongue every 5 (five) minutes as needed.  . Vitamin D,  Ergocalciferol, (DRISDOL) 1.25 MG (50000 UT) CAPS capsule Take 50,000 Units by mouth once a week.   No facility-administered encounter medications on file as of 12/12/2018.      Review of Systems  Review of Systems  Constitutional: Negative for activity change, chills, fatigue, fever and unexpected weight change.  HENT: Negative for postnasal drip, sinus pressure, sneezing and sore throat.   Eyes: Negative.   Respiratory: Negative for cough, shortness of breath and wheezing.   Cardiovascular: Positive for chest pain (Occasional, atypical). Negative for palpitations.  Gastrointestinal: Negative for diarrhea, nausea and vomiting.  Endocrine: Negative.   Musculoskeletal: Negative.   Skin: Negative.   Neurological: Negative for dizziness and headaches.  Psychiatric/Behavioral: Negative.  Negative for dysphoric mood. The patient is not nervous/anxious.   All other systems reviewed and are negative.    Physical Exam  BP 122/80 (BP Location: Left Arm, Patient Position: Sitting, Cuff Size: Normal)   Pulse 74   Temp 97.9 F (36.6 C)   Ht 6' (1.829 m)   Wt 241 lb 14.4 oz (109.7 kg)   SpO2 94%   BMI 32.81 kg/m   Wt Readings  from Last 5 Encounters:  12/12/18 241 lb 14.4 oz (109.7 kg)  10/20/18 238 lb (108 kg)  08/22/18 240 lb (108.9 kg)  06/23/18 230 lb (104.3 kg)  04/15/18 235 lb (106.6 kg)     Physical Exam  Constitutional: He is oriented to person, place, and time and well-developed, well-nourished, and in no distress. No distress.  HENT:  Head: Normocephalic and atraumatic.  Right Ear: Hearing, tympanic membrane, external ear and ear canal normal.  Left Ear: Hearing, tympanic membrane, external ear and ear canal normal.  Nose: Nose normal.  Mouth/Throat: Uvula is midline and oropharynx is clear and moist. No oropharyngeal exudate.  Eyes: Pupils are equal, round, and reactive to light.  Neck: Normal range of motion. Neck supple.  Cardiovascular: Normal rate, regular rhythm  and normal heart sounds.  Pulmonary/Chest: Effort normal and breath sounds normal. No accessory muscle usage. No respiratory distress. He has no decreased breath sounds. He has no wheezes. He has no rhonchi.  Abdominal: Soft. Bowel sounds are normal. He exhibits no distension. There is no abdominal tenderness.  Musculoskeletal: Normal range of motion.        General: No edema.  Lymphadenopathy:    He has no cervical adenopathy.  Neurological: He is alert and oriented to person, place, and time. Gait normal.  Skin: Skin is warm and dry. He is not diaphoretic. No erythema.  Psychiatric: Mood, memory, affect and judgment normal.  Nursing note and vitals reviewed.     Lab Results:  CBC    Component Value Date/Time   WBC 9.9 04/09/2018 0322   RBC 5.21 04/09/2018 0322   HGB 14.8 04/09/2018 0322   HGB 17.9 (H) 03/31/2018 1154   HCT 45.9 04/09/2018 0322   HCT 51.0 03/31/2018 1154   PLT 253 04/09/2018 0322   PLT 229 03/31/2018 1154   MCV 88.1 04/09/2018 0322   MCV 88 03/31/2018 1154   MCH 28.4 04/09/2018 0322   MCHC 32.2 04/09/2018 0322   RDW 13.2 04/09/2018 0322   RDW 13.8 03/31/2018 1154    BMET    Component Value Date/Time   NA 137 04/09/2018 0322   NA 140 03/31/2018 1154   K 3.7 04/09/2018 0322   CL 106 04/09/2018 0322   CO2 23 04/09/2018 0322   GLUCOSE 135 (H) 04/09/2018 0322   BUN 14 04/09/2018 0322   BUN 12 03/31/2018 1154   CREATININE 1.08 04/09/2018 0322   CALCIUM 8.4 (L) 04/09/2018 0322   GFRNONAA >60 04/09/2018 0322   GFRAA >60 04/09/2018 0322    BNP No results found for: BNP  ProBNP No results found for: PROBNP    Assessment & Plan:   OSA (obstructive sleep apnea) Assessment: Excellent CPAP compliance January/2020 split-night sleep study shows AHI of 24.3  Plan: Continue CPAP therapy as prescribed Follow-up with our office in 1 year  Abnormal finding on lung imaging Assessment: April/2020 chest x-ray from primary care shows chronic appearing  lingular opacity which is slightly increased April/2020 CT chest shows lingular scarring no confluent opacities or effusion, stable exam  Plan: Continue to monitor clinically No further imaging is needed at this time  Shortness of breath Assessment: Recent cardiac work-up has been negative PFTs relatively normal, slight minor restriction Stable CT with lingular scarring in April/2020  Plan: Continue to monitor clinically Increase physical activity to reach goal of 30 minutes to an hour of physical activity daily Follow-up in 1 year    Return in about 1 year (around 12/12/2019), or if  symptoms worsen or fail to improve, for Follow up with Dr. Lake Bells.   Lauraine Rinne, NP 12/12/2018   This appointment was 25 minutes long with over 50% of the time in direct face-to-face patient care, assessment, plan of care, and follow-up.

## 2018-12-12 NOTE — Assessment & Plan Note (Signed)
Assessment: Excellent CPAP compliance January/2020 split-night sleep study shows AHI of 24.3  Plan: Continue CPAP therapy as prescribed Follow-up with our office in 1 year

## 2018-12-12 NOTE — Assessment & Plan Note (Signed)
Assessment: Recent cardiac work-up has been negative PFTs relatively normal, slight minor restriction Stable CT with lingular scarring in April/2020  Plan: Continue to monitor clinically Increase physical activity to reach goal of 30 minutes to an hour of physical activity daily Follow-up in 1 year

## 2018-12-12 NOTE — Assessment & Plan Note (Signed)
Assessment: April/2020 chest x-ray from primary care shows chronic appearing lingular opacity which is slightly increased April/2020 CT chest shows lingular scarring no confluent opacities or effusion, stable exam  Plan: Continue to monitor clinically No further imaging is needed at this time

## 2018-12-13 NOTE — Progress Notes (Signed)
Reviewed, agree 

## 2018-12-25 ENCOUNTER — Other Ambulatory Visit: Payer: Self-pay | Admitting: Cardiology

## 2018-12-29 ENCOUNTER — Telehealth: Payer: Self-pay | Admitting: Gastroenterology

## 2018-12-29 NOTE — Telephone Encounter (Signed)
[facility] called regarding prescription for famotidine, rep stated that they do not have it in stock, since ranitidine was recalled, famotidine was been in high demand. She wants to know if Dr. Lyndel Safe would consider either omeprazole or esomeprazole instead.

## 2018-12-29 NOTE — Telephone Encounter (Signed)
Please advise 

## 2018-12-30 NOTE — Telephone Encounter (Signed)
Can just carry on taking Dexilant once a day. Hold off on H2 blockers for now. He needs to let us know if he has any problems. Thanks  Rg

## 2019-01-01 NOTE — Telephone Encounter (Signed)
Patient notified, voiced understanding.

## 2019-01-05 ENCOUNTER — Ambulatory Visit: Payer: PRIVATE HEALTH INSURANCE | Admitting: Adult Health

## 2019-01-26 ENCOUNTER — Other Ambulatory Visit: Payer: Self-pay | Admitting: Cardiology

## 2019-03-25 ENCOUNTER — Other Ambulatory Visit: Payer: Self-pay | Admitting: Cardiology

## 2019-04-13 ENCOUNTER — Other Ambulatory Visit: Payer: Self-pay | Admitting: Pulmonary Disease

## 2019-04-13 DIAGNOSIS — R0602 Shortness of breath: Secondary | ICD-10-CM

## 2019-04-26 ENCOUNTER — Other Ambulatory Visit: Payer: Self-pay | Admitting: Cardiology

## 2019-04-28 NOTE — Telephone Encounter (Signed)
Metoprolol refill sent to [facility] 

## 2019-05-25 ENCOUNTER — Other Ambulatory Visit: Payer: Self-pay | Admitting: Cardiology

## 2019-06-24 ENCOUNTER — Other Ambulatory Visit: Payer: Self-pay | Admitting: Cardiology

## 2019-09-15 ENCOUNTER — Telehealth: Payer: Self-pay | Admitting: Cardiology

## 2019-09-15 NOTE — Telephone Encounter (Signed)
I spoke with patient. He is not having any problems with Plavix but has been told that some people can stop Plavix after a year. His stents were placed in October 2019.  He is also due for follow up in the office. I scheduled patient to see Dr Bettina Gavia on April 21,2021 at 1:35.  Patient has current supply of Plavix to last through this appointment. He will continue Plavix and discuss possibility of stopping it at upcoming appointment in April.

## 2019-09-15 NOTE — Telephone Encounter (Signed)
New Message    Pt c/o medication issue:  1. Name of Medication: clopidogrel (PLAVIX) 75 MG tablet  2. How are you currently taking this medication (dosage and times per day)? 1 tablet daily   3. Are you having a reaction (difficulty breathing--STAT)? No   4. What is your medication issue? Pt is wondering if he can stop taking his Plavix  He says he has been taking it for over a year   Please advise

## 2019-10-06 ENCOUNTER — Other Ambulatory Visit: Payer: Self-pay

## 2019-10-07 ENCOUNTER — Ambulatory Visit: Payer: PRIVATE HEALTH INSURANCE | Admitting: Cardiology

## 2019-10-07 ENCOUNTER — Other Ambulatory Visit: Payer: Self-pay

## 2019-10-07 ENCOUNTER — Encounter: Payer: Self-pay | Admitting: Cardiology

## 2019-10-07 VITALS — BP 122/80 | HR 67 | Temp 97.4°F | Ht 72.0 in | Wt 247.2 lb

## 2019-10-07 DIAGNOSIS — I25118 Atherosclerotic heart disease of native coronary artery with other forms of angina pectoris: Secondary | ICD-10-CM

## 2019-10-07 DIAGNOSIS — E7800 Pure hypercholesterolemia, unspecified: Secondary | ICD-10-CM | POA: Diagnosis not present

## 2019-10-07 DIAGNOSIS — I10 Essential (primary) hypertension: Secondary | ICD-10-CM

## 2019-10-07 NOTE — Patient Instructions (Signed)
Medication Instructions:  Your physician has recommended you make the following change in your medication:  STOP: Aspirin  *If you need a refill on your cardiac medications before your next appointment, please call your pharmacy*   Lab Work: None If you have labs (blood work) drawn today and your tests are completely normal, you will receive your results only by: Marland Kitchen MyChart Message (if you have MyChart) OR . A paper copy in the mail If you have any lab test that is abnormal or we need to change your treatment, we will call you to review the results.   Testing/Procedures: None   Follow-Up: At [facility], you and your health needs are our priority.  As part of our continuing mission to provide you with exceptional heart care, we have created designated Provider Care Teams.  These Care Teams include your primary Cardiologist (physician) and Advanced Practice Providers (APPs -  Physician Assistants and Nurse Practitioners) who all work together to provide you with the care you need, when you need it.  We recommend signing up for the patient portal called "MyChart".  Sign up information is provided on this After Visit Summary.  MyChart is used to connect with patients for Virtual Visits (Telemedicine).  Patients are able to view lab/test results, encounter notes, upcoming appointments, etc.  Non-urgent messages can be sent to your provider as well.   To learn more about what you can do with MyChart, go to [email or web address].    Your next appointment:   1 year(s)  The format for your next appointment:   In Person  Provider:   Shirlee More, MD   Other Instructions

## 2019-10-07 NOTE — Progress Notes (Signed)
Cardiology Office Note:    Date:  10/07/2019   ID:  Sohail Milder, DOB 06-04-1966, MRN [id number]  PCP:  Angelina Sheriff, MD  Cardiologist:  Shirlee More, MD    Referring MD: Angelina Sheriff, MD    ASSESSMENT:    1. Coronary artery disease of native artery of native heart with stable angina pectoris ([place])   2. Benign essential hypertension   3. Hypercholesterolemia    PLAN:    In order of problems listed above:  1. Stable CAD continue medical therapy but dropped out aspirin with what is possibly GI side effects continue clopidogrel and if symptoms become more typical or severe may need to consider coronary angiography. 2. BP at target continue current treatment ACE inhibitor 3. Continue his high intensity statin lipids are ideal   Next appointment: 1 year   Medication Adjustments/Labs and Tests Ordered: Current medicines are reviewed at length with the patient today.  Concerns regarding medicines are outlined above.  No orders of the defined types were placed in this encounter.  No orders of the defined types were placed in this encounter.   Chief Complaint  Patient presents with  . Coronary Artery Disease    History of Present Illness:    Aaron Reese is a 54 y.o. male with a hx of mild nonobstructive CAD in 2010 hypertension hyperlipidemia who developed a rapid progression in his anginal pattern class III and underwent coronary angiography. He underwent coronary angiography was found to have severe mid and proximal LAD stenosis and received 2 drug-eluting stents 04/08/18. He was last seen 10/20/2018 virtual visit during COVID-19 surge. Compliance with diet, lifestyle and medications: Yes  In general is doing well unfortunately has anxiety and at times when he is anxious to go notice a vague discomfort in the chest.  He also gets heartburn indigestion takes aspirin and clopidogrel unrelated to physical effort on relieved with rest.  Same symptoms.  Gated  myocardial perfusion study performed 1 year ago with normal perfusion and function.  His EKG remains normal his lipids are ideal.  We decided to stop aspirin continue his PPI if symptoms worsen or become more typical he may need coronary angiography.  He is not having shortness of breath palpitation or syncope.  Most recent labs from his PCP 08/09/2019 CBC normal creatinine normal 1.10 normal liver function test cholesterol 148 triglyceride 154 HDL 41 LDL 80 A1c 5.6% Past Medical History:  Diagnosis Date  . Abnormal finding on lung imaging 03/31/2018   October 2019 CT chest  showing normal pulmonary parenchyma in the upper lobes but in the bases there is atelectasis and some airway thickening with surrounding groundglass.  Compared to in April 2018 CT scan of his abdomen this does appear to be progressive.  10/07/2018-chest x-ray ([facility])- chronic appearing lingular opacity, no acute infiltrate edema mass or effusion otherwise, chr  . Anxiety 03/24/2018  . Benign essential hypertension 03/24/2018  . CAD (coronary artery disease)    s/p DES to pLAD & mLAD 04/08/18  . Chest pain in adult 03/24/2018  . Chlorine gas exposure 10/08/2018  . Depression 03/24/2018  . Frequent headaches 03/24/2018  . GAD (generalized anxiety disorder) 04/05/2018  . GERD (gastroesophageal reflux disease) 03/24/2018  . Hypercholesterolemia 03/24/2018  . Hypotestosteronemia 03/24/2018  . OSA (obstructive sleep apnea) 10/08/2018   Split-night sleep study January 2020 AHI 24.3 with optimal control on CPAP 12 cm H2O.   . Shortness of breath 10/08/2018   10/08/2018-CT chest  without contrast- lingular scarring, no confluent opacities or effusions otherwise  12/10/2018-pulmonary function test- FVC 4.15 (79% predicted), postbronchodilator ratio 84, postbronchodilator FEV1 3.72 (91% predicted), no bronchodilator response, mid flow reversibility, DLCO 94  . Substernal chest pain 03/24/2018    Past Surgical History:  Procedure  Laterality Date  . CARDIAC CATHETERIZATION    . COLONOSCOPY  11/23/2016   Colon polyps. Diverticulosis of colon. Internal hemorrhoids.   . CORONARY STENT INTERVENTION N/A 04/08/2018   Procedure: CORONARY STENT INTERVENTION;  Surgeon: Jettie Booze, MD;  Location: [place] [facility];  Service: Cardiovascular;  Laterality: N/A;  . LEFT HEART CATH AND CORONARY ANGIOGRAPHY N/A 04/08/2018   Procedure: LEFT HEART CATH AND CORONARY ANGIOGRAPHY;  Surgeon: Jettie Booze, MD;  Location: [place] [facility];  Service: Cardiovascular;  Laterality: N/A;    Current Medications: Current Meds  Medication Sig  . aspirin EC 81 MG tablet Take 1 tablet (81 mg total) by mouth daily.  Marland Kitchen atorvastatin (LIPITOR) 80 MG tablet TAKE ONE TABLET BY MOUTH DAILY AT 6PM  . clopidogrel (PLAVIX) 75 MG tablet TAKE ONE TABLET BY MOUTH DAILY WITH BREAKFAST  . dexlansoprazole (DEXILANT) 60 MG capsule Take 1 capsule (60 mg total) by mouth daily.  . DULoxetine (CYMBALTA) 60 MG capsule Take 60 mg by mouth daily.  Marland Kitchen lisinopril (PRINIVIL,ZESTRIL) 10 MG tablet Take 10 mg by mouth every evening.   . metoprolol succinate (TOPROL-XL) 50 MG 24 hr tablet Take 1 tablet (50 mg total) by mouth daily. Take with or immediately following a meal.  . nitroGLYCERIN (NITROSTAT) 0.4 MG SL tablet Place 1 tablet (0.4 mg total) under the tongue every 5 (five) minutes as needed.  . Vitamin D, Ergocalciferol, (DRISDOL) 1.25 MG (50000 UT) CAPS capsule Take 50,000 Units by mouth once a week.     Allergies:   Citalopram, Sertraline, and Venlafaxine   Social History   Socioeconomic History  . Marital status: Married    Spouse name: Not on file  . Number of children: Not on file  . Years of education: Not on file  . Highest education level: Not on file  Occupational History  . Not on file  Tobacco Use  . Smoking status: Never Smoker  . Smokeless tobacco: Never Used  Substance and Sexual Activity  . Alcohol use: Not Currently  . Drug  use: Not Currently  . Sexual activity: Not on file  Other Topics Concern  . Not on file  Social History Narrative  . Not on file   Social Determinants of Health   Financial Resource Strain:   . Difficulty of Paying Living Expenses:   Food Insecurity:   . Worried About Charity fundraiser in the Last Year:   . Arboriculturist in the Last Year:   Transportation Needs:   . Film/video editor (Medical):   Marland Kitchen Lack of Transportation (Non-Medical):   Physical Activity:   . Days of Exercise per Week:   . Minutes of Exercise per Session:   Stress:   . Feeling of Stress :   Social Connections:   . Frequency of Communication with Friends and Family:   . Frequency of Social Gatherings with Friends and Family:   . Attends Religious Services:   . Active Member of Clubs or Organizations:   . Attends Archivist Meetings:   Marland Kitchen Marital Status:      Family History: The patient's family history includes ALS in his father; Hypertension in his mother; Stroke  in his brother. There is no history of Heart disease, Cancer, or Diabetes. ROS:   Please see the history of present illness.    All other systems reviewed and are negative.  EKGs/Labs/Other Studies Reviewed:    The following studies were reviewed today:  EKG:  EKG ordered today and personally reviewed.  The ekg ordered today demonstrates this rhythm and remains normal  Recent Labs: See history above  Physical Exam:    VS:  BP 122/80   Pulse 67   Temp (!) 97.4 F (36.3 C)   Ht 6' (1.829 m)   Wt 247 lb 3.2 oz (112.1 kg)   SpO2 94%   BMI 33.53 kg/m     Wt Readings from Last 3 Encounters:  10/07/19 247 lb 3.2 oz (112.1 kg)  12/12/18 241 lb 14.4 oz (109.7 kg)  10/20/18 238 lb (108 kg)     GEN:  Well nourished, well developed in no acute distress HEENT: Normal NECK: No JVD; No carotid bruits LYMPHATICS: No lymphadenopathy CARDIAC: RRR, no murmurs, rubs, gallops RESPIRATORY:  Clear to auscultation without  rales, wheezing or rhonchi  ABDOMEN: Soft, non-tender, non-distended MUSCULOSKELETAL:  No edema; No deformity  SKIN: Warm and dry NEUROLOGIC:  Alert and oriented x 3 PSYCHIATRIC:  Normal affect    Signed, Shirlee More, MD  10/07/2019 2:15 PM    [facility]

## 2019-10-26 ENCOUNTER — Telehealth: Payer: Self-pay | Admitting: Cardiology

## 2019-10-26 NOTE — Telephone Encounter (Signed)
Pt c/o of Chest Pain: STAT if CP now or developed within 24 hours  1. Are you having CP right now? yes   2. Are you experiencing any other symptoms (ex. SOB, nausea, vomiting, sweating)? no  3. How long have you been experiencing CP? For a while, did not give specifics  4. Is your CP continuous or coming and going? Comes and goes.  5. Have you taken Nitroglycerin? Yes, no change ?  Patient states he has been having a burning and tightness in his chest. He states he was taken off aspirin, but it has not changed anything. He states he is also having pain in his left arm.

## 2019-10-26 NOTE — Telephone Encounter (Signed)
I spoke with the patient just now and he verified with me that he is having chest pain at this time and has been having this chest pain over the past several days. He states that it will get better when he is sleeping but it starts back when he gets up in the morning and starts any kind of activity. He states that he is not having any other symptoms but the pain is radiating down his left arm. He has taken his nitroglycerin and has not had any relief with taking this.   I spoke with Dr. Harriet Masson in regards to this issue and she recommended that he proceed to the ED.    The patient is aware and says he will proceed to the ED at this time.

## 2019-11-18 ENCOUNTER — Encounter: Payer: Self-pay | Admitting: Cardiology

## 2019-11-19 ENCOUNTER — Telehealth: Payer: Self-pay

## 2019-11-19 NOTE — Telephone Encounter (Signed)
Spoke to the patient just now and let him know that Dr. Bettina Gavia is requesting that he be seen in the office next week. He verbalizes understanding of this and we got him scheduled for 11/24/19 at 9:40 AM. Per Dr. Bettina Gavia it was ok to double book that day.

## 2019-11-23 ENCOUNTER — Other Ambulatory Visit: Payer: Self-pay

## 2019-11-23 NOTE — Progress Notes (Signed)
Cardiology Office Note:    Date:  11/24/2019   ID:  Aaron Reese, DOB 1965/08/11, MRN [id number]  PCP:  Angelina Sheriff, MD  Cardiologist:  Shirlee More, MD    Referring MD: Carrolyn Meiers, NP   ASSESSMENT:    1. Coronary artery disease of native artery of native heart with stable angina pectoris (Schneider)   2. Benign essential hypertension   3. Hypercholesterolemia    PLAN:    In order of problems listed above:  1. Atypical symptoms in the context of known CAD anxiety and reflux.  He is hesitant to undergo coronary angiography we will check a high-sensitivity troponin look at the EKG from his PCP office and things are stable plan a myocardial perfusion study in office and continue his current medical therapy including clopidogrel statin beta-blocker. 2. Continue current medical therapy ACE inhibitor 3. Continue statin requested labs including lipid profile from his PCP   Next appointment: 1 month   Medication Adjustments/Labs and Tests Ordered: Current medicines are reviewed at length with the patient today.  Concerns regarding medicines are outlined above.  No orders of the defined types were placed in this encounter.  No orders of the defined types were placed in this encounter.   Chief Complaint  Patient presents with  . Follow-up  . Coronary Artery Disease    History of Present Illness:    Aaron Reese is a 54 y.o. male with a hx of mild nonobstructive CAD in 2010 hypertension hyperlipidemia who developed a rapid progression in his anginal pattern class III and underwent coronary angiography. He underwent coronary angiography was found to have severe mid and proximal LAD stenosis and received 2 drug-eluting stents 04/08/18.  He was last seen 10/07/2019.  At that time aspirin was dropped out he was continued on clopidogrel.  He had a normal myocardial perfusion study performed in the office 1 year ago.  I received a call last week from his primary care physician that he  ongoing symptoms raising concerns for ischemia and advised him to be seen by me and discussed with him the role of coronary angiography.  Compliance with diet, lifestyle and medications: Yes  Is not doing well multiple problems including anxiety and diffuse myalgias.  He complains of burning is in his left chest not exertional on relieved with rest on relieved with nitroglycerin and it waxes and wanes through the day never severe enough to stop him tends to go away in the evening.  In 2019 he had typical angina.  He is unsure about his heart disease we discussed further evaluation after review of benefits and options wants to repeat a myocardial perfusion study but we will set up an office in for assurance check high-sensitivity troponin today if elevated treat him as acute coronary syndrome.  No shortness of breath palpitations or syncope.  The EKG that was described to me is unchanged by his PCP and I will request a copy of her cardiology.-Repeat today  Left heart cath 04/08/2018: Coronary Diagrams  Diagnostic Dominance: Right  Intervention    Past Medical History:  Diagnosis Date  . Abnormal finding on lung imaging 03/31/2018   October 2019 CT chest  showing normal pulmonary parenchyma in the upper lobes but in the bases there is atelectasis and some airway thickening with surrounding groundglass.  Compared to in April 2018 CT scan of his abdomen this does appear to be progressive.  10/07/2018-chest x-ray ([facility])- chronic appearing lingular opacity, no acute infiltrate edema  mass or effusion otherwise, chr  . Anxiety 03/24/2018  . Benign essential hypertension 03/24/2018  . CAD (coronary artery disease)    s/p DES to pLAD & mLAD 04/08/18  . Chest pain in adult 03/24/2018  . Chlorine gas exposure 10/08/2018  . Depression 03/24/2018  . Frequent headaches 03/24/2018  . GAD (generalized anxiety disorder) 04/05/2018  . GERD (gastroesophageal reflux disease) 03/24/2018  .  Hypercholesterolemia 03/24/2018  . Hypotestosteronemia 03/24/2018  . OSA (obstructive sleep apnea) 10/08/2018   Split-night sleep study January 2020 AHI 24.3 with optimal control on CPAP 12 cm H2O.   . Shortness of breath 10/08/2018   10/08/2018-CT chest without contrast- lingular scarring, no confluent opacities or effusions otherwise  12/10/2018-pulmonary function test- FVC 4.15 (79% predicted), postbronchodilator ratio 84, postbronchodilator FEV1 3.72 (91% predicted), no bronchodilator response, mid flow reversibility, DLCO 94  . Substernal chest pain 03/24/2018    Past Surgical History:  Procedure Laterality Date  . CARDIAC CATHETERIZATION    . COLONOSCOPY  11/23/2016   Colon polyps. Diverticulosis of colon. Internal hemorrhoids.   . CORONARY STENT INTERVENTION N/A 04/08/2018   Procedure: CORONARY STENT INTERVENTION;  Surgeon: Jettie Booze, MD;  Location: [facility];  Service: Cardiovascular;  Laterality: N/A;  . LEFT HEART CATH AND CORONARY ANGIOGRAPHY N/A 04/08/2018   Procedure: LEFT HEART CATH AND CORONARY ANGIOGRAPHY;  Surgeon: Jettie Booze, MD;  Location: [facility];  Service: Cardiovascular;  Laterality: N/A;    Current Medications: Current Meds  Medication Sig  . atorvastatin (LIPITOR) 80 MG tablet TAKE ONE TABLET BY MOUTH DAILY AT 6PM  . clopidogrel (PLAVIX) 75 MG tablet TAKE ONE TABLET BY MOUTH DAILY WITH BREAKFAST  . dexlansoprazole (DEXILANT) 60 MG capsule Take 1 capsule (60 mg total) by mouth daily.  Marland Kitchen dicyclomine (BENTYL) 20 MG tablet Take 5 mg by mouth in the morning, at noon, in the evening, and at bedtime.  . DULoxetine (CYMBALTA) 60 MG capsule Take 60 mg by mouth daily.  Marland Kitchen levocetirizine (XYZAL) 5 MG tablet Take 5 mg by mouth every evening.  Marland Kitchen lisinopril (PRINIVIL,ZESTRIL) 10 MG tablet Take 10 mg by mouth every evening.   . metoprolol succinate (TOPROL-XL) 50 MG 24 hr tablet Take 1 tablet (50 mg total) by mouth daily. Take with or  immediately following a meal.  . nitroGLYCERIN (NITROSTAT) 0.4 MG SL tablet Place 1 tablet (0.4 mg total) under the tongue every 5 (five) minutes as needed.  . traZODone (DESYREL) 50 MG tablet Take 50 mg by mouth at bedtime.  . Vitamin D, Ergocalciferol, (DRISDOL) 1.25 MG (50000 UT) CAPS capsule Take 50,000 Units by mouth once a week.     Allergies:   Citalopram, Sertraline, and Venlafaxine   Social History   Socioeconomic History  . Marital status: Married    Spouse name: Not on file  . Number of children: Not on file  . Years of education: Not on file  . Highest education level: Not on file  Occupational History  . Not on file  Tobacco Use  . Smoking status: Never Smoker  . Smokeless tobacco: Never Used  Substance and Sexual Activity  . Alcohol use: Not Currently  . Drug use: Not Currently  . Sexual activity: Not on file  Other Topics Concern  . Not on file  Social History Narrative  . Not on file   Social Determinants of Health   Financial Resource Strain:   . Difficulty of Paying Living Expenses:   Food Insecurity:   .  Worried About Charity fundraiser in the Last Year:   . Arboriculturist in the Last Year:   Transportation Needs:   . Film/video editor (Medical):   Marland Kitchen Lack of Transportation (Non-Medical):   Physical Activity:   . Days of Exercise per Week:   . Minutes of Exercise per Session:   Stress:   . Feeling of Stress :   Social Connections:   . Frequency of Communication with Friends and Family:   . Frequency of Social Gatherings with Friends and Family:   . Attends Religious Services:   . Active Member of Clubs or Organizations:   . Attends Archivist Meetings:   Marland Kitchen Marital Status:      Family History: The patient's family history includes ALS in his father; Hypertension in his mother; Stroke in his brother. There is no history of Heart disease, Cancer, or Diabetes. ROS:   Please see the history of present illness.    All other  systems reviewed and are negative.  EKGs/Labs/Other Studies Reviewed:    The following studies were reviewed today:  EKG: Also requested from his PCP I received the EKG from his primary care physician showing sinus rhythm no ischemic changes or pattern of MI  Recent Labs: Also requested from his PCPal Exam:    VS:  BP 122/76   Pulse 76   Ht 6' (1.829 m)   Wt 253 lb 12.8 oz (115.1 kg)   SpO2 95%   BMI 34.42 kg/m     Wt Readings from Last 3 Encounters:  11/24/19 253 lb 12.8 oz (115.1 kg)  10/07/19 247 lb 3.2 oz (112.1 kg)  12/12/18 241 lb 14.4 oz (109.7 kg)     GEN:  Well nourished, well developed in no acute distress HEENT: Normal NECK: No JVD; No carotid bruits LYMPHATICS: No lymphadenopathy CARDIAC: RRR, no murmurs, rubs, gallops RESPIRATORY:  Clear to auscultation without rales, wheezing or rhonchi  ABDOMEN: Soft, non-tender, non-distended MUSCULOSKELETAL:  No edema; No deformity  SKIN: Warm and dry NEUROLOGIC:  Alert and oriented x 3 PSYCHIATRIC:  Normal affect    Signed, Shirlee More, MD  11/24/2019 9:43 AM    [place]

## 2019-11-24 ENCOUNTER — Encounter: Payer: Self-pay | Admitting: Cardiology

## 2019-11-24 ENCOUNTER — Ambulatory Visit: Payer: PRIVATE HEALTH INSURANCE | Admitting: Cardiology

## 2019-11-24 ENCOUNTER — Other Ambulatory Visit: Payer: Self-pay

## 2019-11-24 VITALS — BP 122/76 | HR 76 | Ht 72.0 in | Wt 253.8 lb

## 2019-11-24 DIAGNOSIS — I10 Essential (primary) hypertension: Secondary | ICD-10-CM | POA: Diagnosis not present

## 2019-11-24 DIAGNOSIS — R079 Chest pain, unspecified: Secondary | ICD-10-CM

## 2019-11-24 DIAGNOSIS — I25118 Atherosclerotic heart disease of native coronary artery with other forms of angina pectoris: Secondary | ICD-10-CM | POA: Diagnosis not present

## 2019-11-24 DIAGNOSIS — E7800 Pure hypercholesterolemia, unspecified: Secondary | ICD-10-CM

## 2019-11-24 LAB — TROPONIN T: Troponin T (Highly Sensitive): <6 ng/L (ref 0–22)

## 2019-11-24 NOTE — Patient Instructions (Signed)
Medication Instructions:  Your physician recommends that you continue on your current medications as directed. Please refer to the Current Medication list given to you today.  *If you need a refill on your cardiac medications before your next appointment, please call your pharmacy*   Lab Work: Your physician recommends that you return for lab work in: TODAY HS TROPONIN If you have labs (blood work) drawn today and your tests are completely normal, you will receive your results only by: Marland Kitchen MyChart Message (if you have MyChart) OR . A paper copy in the mail If you have any lab test that is abnormal or we need to change your treatment, we will call you to review the results.   Testing/Procedures:   [facility] [address] [place], [place] [address] Phone:  [phone]    Please arrive 15 minutes prior to your appointment time for registration and insurance purposes.  The test will take approximately 3 to 4 hours to complete; you may bring reading material.  If someone comes with you to your appointment, they will need to remain in the main lobby due to limited space in the testing area. **If you are pregnant or breastfeeding, please notify the nuclear lab prior to your appointment**  How to prepare for your Myocardial Perfusion Test: . Do not eat or drink 3 hours prior to your test, except you may have water. . Do not consume products containing caffeine (regular or decaffeinated) 12 hours prior to your test. (ex: coffee, chocolate, sodas, tea). . Do bring a list of your current medications with you.  If not listed below, you may take your medications as normal. . Do wear comfortable clothes (no dresses or overalls) and walking shoes, tennis shoes preferred (No heels or open toe shoes are allowed). . Do NOT wear cologne, perfume, aftershave, or lotions (deodorant is allowed). . If these instructions are not followed, your test will have to be  rescheduled.  Please report to [address] for your test.  If you have questions or concerns about your appointment, you can call the [facility] at [phone].  If you cannot keep your appointment, please provide 24 hours notification to the Nuclear Lab, to avoid a possible $50 charge to your account.    Follow-Up: At [facility], you and your health needs are our priority.  As part of our continuing mission to provide you with exceptional heart care, we have created designated Provider Care Teams.  These Care Teams include your primary Cardiologist (physician) and Advanced Practice Providers (APPs -  Physician Assistants and Nurse Practitioners) who all work together to provide you with the care you need, when you need it.  We recommend signing up for the patient portal called "MyChart".  Sign up information is provided on this After Visit Summary.  MyChart is used to connect with patients for Virtual Visits (Telemedicine).  Patients are able to view lab/test results, encounter notes, upcoming appointments, etc.  Non-urgent messages can be sent to your provider as well.   To learn more about what you can do with MyChart, go to [email or web address].    Your next appointment:   1 month(s)  The format for your next appointment:   In Person  Provider:   Shirlee More, MD   Other Instructions

## 2019-12-15 ENCOUNTER — Telehealth (HOSPITAL_COMMUNITY): Payer: Self-pay | Admitting: *Deleted

## 2019-12-15 NOTE — Telephone Encounter (Signed)
Patient given detailed instructions per Myocardial Perfusion Study Information Sheet for the test on 12/22/19 at 7:45. Patient notified to arrive 15 minutes early and that it is imperative to arrive on time for appointment to keep from having the test rescheduled.  If you need to cancel or reschedule your appointment, please call the office within 24 hours of your appointment. . Patient verbalized understanding.Aaron Reese

## 2019-12-22 ENCOUNTER — Ambulatory Visit (INDEPENDENT_AMBULATORY_CARE_PROVIDER_SITE_OTHER): Payer: PRIVATE HEALTH INSURANCE

## 2019-12-22 ENCOUNTER — Telehealth: Payer: Self-pay

## 2019-12-22 ENCOUNTER — Other Ambulatory Visit: Payer: Self-pay

## 2019-12-22 DIAGNOSIS — R079 Chest pain, unspecified: Secondary | ICD-10-CM | POA: Diagnosis not present

## 2019-12-22 LAB — MYOCARDIAL PERFUSION IMAGING
LV dias vol: 74 mL (ref 62–150)
LV sys vol: 31 mL
Peak HR: 96 {beats}/min
Rest HR: 62 {beats}/min
SDS: 4
SRS: 8
SSS: 11
TID: 0.97

## 2019-12-22 MED ORDER — TECHNETIUM TC 99M TETROFOSMIN IV KIT
11.0000 | PACK | Freq: Once | INTRAVENOUS | Status: AC | PRN
  Administered 2019-12-22: 11 via INTRAVENOUS

## 2019-12-22 MED ORDER — REGADENOSON 0.4 MG/5ML IV SOLN
0.4000 mg | Freq: Once | INTRAVENOUS | Status: AC
  Administered 2019-12-22: 0.4 mg via INTRAVENOUS

## 2019-12-22 MED ORDER — TECHNETIUM TC 99M TETROFOSMIN IV KIT
31.8000 | PACK | Freq: Once | INTRAVENOUS | Status: AC | PRN
  Administered 2019-12-22: 31.8 via INTRAVENOUS

## 2019-12-22 NOTE — Telephone Encounter (Signed)
Spoke with patient regarding results and recommendation.  Patient verbalizes understanding and is agreeable to plan of care. Advised patient to call back with any issues or concerns.  

## 2019-12-22 NOTE — Telephone Encounter (Signed)
-----   Message from Berniece Salines, DO sent at 12/22/2019  1:42 PM EDT ----- Stress test normal.

## 2019-12-28 NOTE — Progress Notes (Deleted)
Cardiology Office Note:    Date:  12/28/2019   ID:  Aaron Reese, DOB 1966-04-20, MRN [id number]  PCP:  Aaron Rouse, FNP  Cardiologist:  Aaron More, MD    Referring MD: Aaron Rouse, FNP    ASSESSMENT:    No diagnosis found. PLAN:    In order of problems listed above:  1. ***   Next appointment: ***   Medication Adjustments/Labs and Tests Ordered: Current medicines are reviewed at length with the patient today.  Concerns regarding medicines are outlined above.  No orders of the defined types were placed in this encounter.  No orders of the defined types were placed in this encounter.   No chief complaint on file.   History of Present Illness:    Aaron Reese is a 54 y.o. male with a hx of  mild nonobstructive CAD in 2010 hypertension hyperlipidemia who developed a rapid progression in his anginal pattern class III and underwent coronary angiography. He underwent coronary angiography was found to have severe mid and proximal LAD stenosis and received 2 drug-eluting stents 04/08/18.   He was last seen . Compliance with diet, lifestyle and medications: 11/24/2019 with recurrent chest pain he underwent myocardial perfusion study 12/22/2019 which was normal.  I independently reviewed the images and agree. Past Medical History:  Diagnosis Date  . Abnormal finding on lung imaging 03/31/2018   October 2019 CT chest  showing normal pulmonary parenchyma in the upper lobes but in the bases there is atelectasis and some airway thickening with surrounding groundglass.  Compared to in April 2018 CT scan of his abdomen this does appear to be progressive.  10/07/2018-chest x-ray ([facility])- chronic appearing lingular opacity, no acute infiltrate edema mass or effusion otherwise, chr  . Anxiety 03/24/2018  . Benign essential hypertension 03/24/2018  . CAD (coronary artery disease)    s/p DES to pLAD & mLAD 04/08/18  . Chest pain in adult 03/24/2018  . Chlorine  gas exposure 10/08/2018  . Depression 03/24/2018  . Frequent headaches 03/24/2018  . GAD (generalized anxiety disorder) 04/05/2018  . GERD (gastroesophageal reflux disease) 03/24/2018  . Hypercholesterolemia 03/24/2018  . Hypotestosteronemia 03/24/2018  . OSA (obstructive sleep apnea) 10/08/2018   Split-night sleep study January 2020 AHI 24.3 with optimal control on CPAP 12 cm H2O.   . Shortness of breath 10/08/2018   10/08/2018-CT chest without contrast- lingular scarring, no confluent opacities or effusions otherwise  12/10/2018-pulmonary function test- FVC 4.15 (79% predicted), postbronchodilator ratio 84, postbronchodilator FEV1 3.72 (91% predicted), no bronchodilator response, mid flow reversibility, DLCO 94  . Substernal chest pain 03/24/2018    Past Surgical History:  Procedure Laterality Date  . CARDIAC CATHETERIZATION    . COLONOSCOPY  11/23/2016   Colon polyps. Diverticulosis of colon. Internal hemorrhoids.   . CORONARY STENT INTERVENTION N/A 04/08/2018   Procedure: CORONARY STENT INTERVENTION;  Surgeon: Aaron Booze, MD;  Location: [facility];  Service: Cardiovascular;  Laterality: N/A;  . LEFT HEART CATH AND CORONARY ANGIOGRAPHY N/A 04/08/2018   Procedure: LEFT HEART CATH AND CORONARY ANGIOGRAPHY;  Surgeon: Aaron Booze, MD;  Location: [facility];  Service: Cardiovascular;  Laterality: N/A;    Current Medications: No outpatient medications have been marked as taking for the 12/29/19 encounter (Appointment) with Aaron Priest, MD.     Allergies:   Citalopram, Sertraline, and Venlafaxine   Social History   Socioeconomic History  . Marital status: Married    Spouse name: Not on  file  . Number of children: Not on file  . Years of education: Not on file  . Highest education level: Not on file  Occupational History  . Not on file  Tobacco Use  . Smoking status: Never Smoker  . Smokeless tobacco: Never Used  Vaping Use  . Vaping Use: Never used   Substance and Sexual Activity  . Alcohol use: Not Currently  . Drug use: Not Currently  . Sexual activity: Not on file  Other Topics Concern  . Not on file  Social History Narrative  . Not on file   Social Determinants of Health   Financial Resource Strain:   . Difficulty of Paying Living Expenses:   Food Insecurity:   . Worried About Charity fundraiser in the Last Year:   . Arboriculturist in the Last Year:   Transportation Needs:   . Film/video editor (Medical):   Marland Kitchen Lack of Transportation (Non-Medical):   Physical Activity:   . Days of Exercise per Week:   . Minutes of Exercise per Session:   Stress:   . Feeling of Stress :   Social Connections:   . Frequency of Communication with Friends and Family:   . Frequency of Social Gatherings with Friends and Family:   . Attends Religious Services:   . Active Member of Clubs or Organizations:   . Attends Archivist Meetings:   Marland Kitchen Marital Status:      Family History: The patient's ***family history includes ALS in his father; Hypertension in his mother; Stroke in his brother. There is no history of Heart disease, Cancer, or Diabetes. ROS:   Please see the history of present illness.    All other systems reviewed and are negative.  EKGs/Labs/Other Studies Reviewed:    The following studies were reviewed today:  EKG:  EKG ordered today and personally reviewed.  The ekg ordered today demonstrates ***  Recent Labs: No results found for requested labs within last 8760 hours.  Recent Lipid Panel No results found for: CHOL, TRIG, HDL, CHOLHDL, VLDL, LDLCALC, LDLDIRECT  Physical Exam:    VS:  There were no vitals taken for this visit.    Wt Readings from Last 3 Encounters:  12/22/19 253 lb (114.8 kg)  11/24/19 253 lb 12.8 oz (115.1 kg)  10/07/19 247 lb 3.2 oz (112.1 kg)     GEN: *** Well nourished, well developed in no acute distress HEENT: Normal NECK: No JVD; No carotid bruits LYMPHATICS: No  lymphadenopathy CARDIAC: ***RRR, no murmurs, rubs, gallops RESPIRATORY:  Clear to auscultation without rales, wheezing or rhonchi  ABDOMEN: Soft, non-tender, non-distended MUSCULOSKELETAL:  No edema; No deformity  SKIN: Warm and dry NEUROLOGIC:  Alert and oriented x 3 PSYCHIATRIC:  Normal affect    Signed, Aaron More, MD  12/28/2019 8:56 AM    [place]

## 2019-12-29 ENCOUNTER — Ambulatory Visit: Payer: PRIVATE HEALTH INSURANCE | Admitting: Cardiology

## 2020-03-04 ENCOUNTER — Encounter: Payer: Self-pay | Admitting: Gastroenterology

## 2020-05-06 ENCOUNTER — Telehealth: Payer: Self-pay

## 2020-05-06 ENCOUNTER — Encounter: Payer: Self-pay | Admitting: Gastroenterology

## 2020-05-06 ENCOUNTER — Telehealth: Payer: Self-pay | Admitting: Gastroenterology

## 2020-05-06 ENCOUNTER — Ambulatory Visit: Payer: PRIVATE HEALTH INSURANCE | Admitting: Gastroenterology

## 2020-05-06 VITALS — BP 114/74 | HR 76 | Ht 72.0 in | Wt 247.1 lb

## 2020-05-06 DIAGNOSIS — R0789 Other chest pain: Secondary | ICD-10-CM

## 2020-05-06 DIAGNOSIS — K219 Gastro-esophageal reflux disease without esophagitis: Secondary | ICD-10-CM

## 2020-05-06 MED ORDER — AMBULATORY NON FORMULARY MEDICATION: 1 refills | Status: DC

## 2020-05-06 MED ORDER — FLUTICASONE-EMOLLIENT 0.05 % EX KIT: PACK | CUTANEOUS | 2 refills | Status: DC

## 2020-05-06 NOTE — Telephone Encounter (Signed)
   Primary Cardiologist: Shirlee More, MD  Chart reviewed as part of pre-operative protocol coverage.   Per Dr. Bettina Gavia, patient can hold plavix 5 days prior to his upcoming endoscopy with plans to restart as soon as he is cleared to do so by his gastroenterologist.   I will route this recommendation to the requesting party via [facility] fax function and remove from pre-op pool.  Please call with questions.  Abigail Butts, PA-C 05/06/2020, 1:20 PM

## 2020-05-06 NOTE — Telephone Encounter (Signed)
[facility] Pre-operative Risk Assessment     Request for surgical clearance:     Endoscopy Procedure  What type of surgery is being performed?     Endoscopy  When is this surgery scheduled?     12/*11/2019  What type of clearance is required ?   Pharmacy  Are there any medications that need to be held prior to surgery and how long? Plavix - 5 days  Practice name and name of physician performing surgery?      [facility]  What is your office phone and fax number?      Phone- [phone]  [phone]  Anesthesia type (None, local, MAC, general) ?       MAC

## 2020-05-06 NOTE — Patient Instructions (Signed)
We have sent the following medications to your pharmacy for you to pick up at your convenience:  Pravacid, Pepcid, Gi cocktail, Fluticasone  You have been scheduled for an endoscopy. Please follow written instructions given to you at your visit today. If you use inhalers (even only as needed), please bring them with you on the day of your procedure.

## 2020-05-06 NOTE — Progress Notes (Signed)
Chief Complaint: Gastroesophageal reflux  Referring Provider:  Janalyn Rouse, FNP      ASSESSMENT AND PLAN;   #1. GERD -failed Protonix/Zantac. #2. Atypical chest pains. Pt with H/O CAD s/p PCI/DES to LAD 03/2018 on ASA/plavix (followed by Dr. Bettina Gavia), HTN, HLD. Neg CT abdo/pevis 09/2016. #3. Recently diagnosed with sleep apnea on CPAP. #4. Internal hemorrhoids vs small anal fissure  Plan: - Pravacid 30mg  po qd - Pepcid 40mg  po qhs. - GI cocktail 5 cc po Q6hrs prn - EGD if OK with Dr Bettina Gavia to hold plavix 5 days before and after cardiology clearance. - fluticasone cream 0.05% generic 30g 1 bid PR x 10 days, 2 refills   HPI:    Aaron Reese is a 54 y.o. male   Has been having significant reflux symptoms including heartburn, regurgitation especially at night and in the morning without any odynophagia or dysphagia. This is despite Protonix and Zantac. No melena or hematochezia  Also has been having substernal chest pains.  Has been told that he has noncardiac chest pains with negative cardiac and pulmonary evaluation.  Advised GI evaluation.  Is followed by pulmonary.  He had positive sleep study and is currently on CPAP.  Also being evaluated for questionable ILD  Wants to get EGD performed as per Dr. Janace Aris recommendations.  History of coronary artery disease status post PCI/DES to LAD 04/08/2018, patient is currently on aspirin and Plavix.  Being followed by Dr. Bettina Gavia.  Denies having any diarrhea or constipation.  Past GI procedures: -Colonoscopy 11/23/2016-6 mm sigmoid polyp status post polypectomy, mild sigmoid diverticulosis. Bx- TA. Rpt 2023. -CT 10/08/2016- neg. Past Medical History:  Diagnosis Date  . Abnormal finding on lung imaging 03/31/2018   October 2019 CT chest  showing normal pulmonary parenchyma in the upper lobes but in the bases there is atelectasis and some airway thickening with surrounding groundglass.  Compared to in April 2018 CT scan of  his abdomen this does appear to be progressive.  10/07/2018-chest x-ray ([facility])- chronic appearing lingular opacity, no acute infiltrate edema mass or effusion otherwise, chr  . Anxiety 03/24/2018  . Benign essential hypertension 03/24/2018  . CAD (coronary artery disease)    s/p DES to pLAD & mLAD 04/08/18  . Chest pain in adult 03/24/2018  . Chlorine gas exposure 10/08/2018  . Depression 03/24/2018  . Frequent headaches 03/24/2018  . GAD (generalized anxiety disorder) 04/05/2018  . GERD (gastroesophageal reflux disease) 03/24/2018  . Hypercholesterolemia 03/24/2018  . Hypotestosteronemia 03/24/2018  . OSA (obstructive sleep apnea) 10/08/2018   Split-night sleep study January 2020 AHI 24.3 with optimal control on CPAP 12 cm H2O.   . Shortness of breath 10/08/2018   10/08/2018-CT chest without contrast- lingular scarring, no confluent opacities or effusions otherwise  12/10/2018-pulmonary function test- FVC 4.15 (79% predicted), postbronchodilator ratio 84, postbronchodilator FEV1 3.72 (91% predicted), no bronchodilator response, mid flow reversibility, DLCO 94  . Substernal chest pain 03/24/2018    Past Surgical History:  Procedure Laterality Date  . CARDIAC CATHETERIZATION    . COLONOSCOPY  11/23/2016   Colon polyps. Diverticulosis of colon. Internal hemorrhoids.   . CORONARY STENT INTERVENTION N/A 04/08/2018   Procedure: CORONARY STENT INTERVENTION;  Surgeon: Jettie Booze, MD;  Location: [facility];  Service: Cardiovascular;  Laterality: N/A;  . LEFT HEART CATH AND CORONARY ANGIOGRAPHY N/A 04/08/2018   Procedure: LEFT HEART CATH AND CORONARY ANGIOGRAPHY;  Surgeon: Jettie Booze, MD;  Location: [facility];  Service: Cardiovascular;  Laterality: N/A;    Family History  Problem Relation Age of Onset  . Hypertension Mother   . ALS Father   . Stroke Brother   . Heart disease Neg Hx   . Cancer Neg Hx   . Diabetes Neg Hx     Social History    Tobacco Use  . Smoking status: Never Smoker  . Smokeless tobacco: Never Used  Vaping Use  . Vaping Use: Never used  Substance Use Topics  . Alcohol use: Not Currently  . Drug use: Not Currently    Current Outpatient Medications  Medication Sig Dispense Refill  . clopidogrel (PLAVIX) 75 MG tablet TAKE ONE TABLET BY MOUTH DAILY WITH BREAKFAST 30 tablet 11  . dexlansoprazole (DEXILANT) 60 MG capsule Take 1 capsule (60 mg total) by mouth daily. 30 capsule 11  . dicyclomine (BENTYL) 20 MG tablet Take 5 mg by mouth in the morning, at noon, in the evening, and at bedtime.    . DULoxetine (CYMBALTA) 60 MG capsule Take 60 mg by mouth daily.    . lansoprazole (PREVACID) 15 MG capsule Take 15 mg by mouth daily.    Marland Kitchen levocetirizine (XYZAL) 5 MG tablet Take 5 mg by mouth every evening.    Marland Kitchen lisinopril (PRINIVIL,ZESTRIL) 10 MG tablet Take 10 mg by mouth every evening.   1  . metoprolol succinate (TOPROL-XL) 50 MG 24 hr tablet Take 1 tablet (50 mg total) by mouth daily. Take with or immediately following a meal. 30 tablet 3  . rosuvastatin (CRESTOR) 40 MG tablet Take 40 mg by mouth daily.    . nitroGLYCERIN (NITROSTAT) 0.4 MG SL tablet Place 1 tablet (0.4 mg total) under the tongue every 5 (five) minutes as needed. (Patient not taking: Reported on 05/06/2020) 25 tablet 2   No current facility-administered medications for this visit.    Allergies  Allergen Reactions  . Citalopram Other (See Comments)    "ineffective"  . Sertraline Other (See Comments)    "ineffective"  . Venlafaxine Other (See Comments)    "limited efficacy"    Review of Systems:  neg     Physical Exam:    BP 114/74   Pulse 76   Ht 6' (1.829 m)   Wt 247 lb 2 oz (112.1 kg)   BMI 33.52 kg/m  Filed Weights   05/06/20 1038  Weight: 247 lb 2 oz (112.1 kg)   Constitutional:  Well-developed, in no acute distress. Psychiatric: Normal mood and affect. Behavior is normal. HEENT: Pupils normal.  Conjunctivae are  normal. No scleral icterus. Neck supple.  Cardiovascular: Normal rate, regular rhythm. No edema Pulmonary/chest: Effort normal and breath sounds normal. No wheezing, rales or rhonchi. Abdominal: Soft, nondistended. Nontender. Bowel sounds active throughout. There are no masses palpable. No hepatomegaly. Rectal:  defered Neurological: Alert and oriented to person place and time. Skin: Skin is warm and dry. No rashes noted.  Data Reviewed: I have personally reviewed following labs and imaging studies  CBC: CBC Latest Ref Rng & Units 04/09/2018 03/31/2018  WBC 4.0 - 10.5 K/uL 9.9 12.8(H)  Hemoglobin 13.0 - 17.0 g/dL 14.8 17.9(H)  Hematocrit 39 - 52 % 45.9 51.0  Platelets 150 - 400 K/uL 253 229    CMP: CMP Latest Ref Rng & Units 04/09/2018 03/31/2018  Glucose 70 - 99 mg/dL 135(H) 93  BUN 6 - 20 mg/dL 14 12  Creatinine 0.61 - 1.24 mg/dL 1.08 1.20  Sodium 135 - 145 mmol/L 137 140  Potassium  3.5 - 5.1 mmol/L 3.7 4.6  Chloride 98 - 111 mmol/L 106 100  CO2 22 - 32 mmol/L 23 25  Calcium 8.9 - 10.3 mg/dL 8.4(L) 9.6     Carmell Austria, MD 05/06/2020, 11:02 AM  Cc: Janalyn Rouse, FNP

## 2020-05-06 NOTE — Telephone Encounter (Signed)
error 

## 2020-05-06 NOTE — Telephone Encounter (Signed)
Aaron Reese from [facility] is wanting to inform the nurse they are not able to obtain the medication Fluticasone-Emollient, if an alternative medication could be prescribed instead.

## 2020-05-06 NOTE — Telephone Encounter (Signed)
Ok to hold plavix 

## 2020-05-06 NOTE — Telephone Encounter (Signed)
   Primary Cardiologist: Shirlee More, MD  Chart reviewed as part of pre-operative protocol coverage.   Patient has a history of CAD s/p PCI/DES to LAD x2 in 2019. Last seen by cardiology at an outpatient visit with Dr. Bettina Gavia 11/2019, at which time he reported atypical chest pain. He underwent a NST 12/2019 which was without ischemia.   Dr. Bettina Gavia - any objections to holding plavix 5 days prior to his upcoming endoscopy? Please route your response back to [facility] PREOP.   Thanks!  Abigail Butts, PA-C 05/06/2020, 12:09 PM

## 2020-05-09 ENCOUNTER — Encounter: Payer: Self-pay | Admitting: Gastroenterology

## 2020-05-09 NOTE — Telephone Encounter (Signed)
Called and spoke to pt.  He confirmed he rec'd my message and understands to hold Plavix for 5 days starting on 05-18-20.

## 2020-05-09 NOTE — Telephone Encounter (Signed)
Please advise if you would like to change this medication to something else.

## 2020-05-09 NOTE — Telephone Encounter (Signed)
Called pt and left message that he has been cleared to hold Plavix for 5 days prior to procedure with Dr. Lyndel Safe on 12-6.  He should hold starting on 12-1.  Asked pt to call back to let us know he has rec'd the message and see if we can answer any questions.

## 2020-05-09 NOTE — Telephone Encounter (Signed)
Follow up to make sure pt calls back

## 2020-05-11 ENCOUNTER — Other Ambulatory Visit: Payer: Self-pay

## 2020-05-11 MED ORDER — HYDROCORTISONE (PERIANAL) 2.5 % EX CREA: 1.0000 "application " | TOPICAL_CREAM | Freq: Two times a day (BID) | CUTANEOUS | 2 refills | Status: DC

## 2020-05-11 NOTE — Telephone Encounter (Signed)
We will use hydrocortisone 2.5% cream RG

## 2020-05-11 NOTE — Telephone Encounter (Signed)
Medication sent to pharmacy  

## 2020-05-11 NOTE — Progress Notes (Signed)
Medication was sent in as a replacement of the fluticasone-emollient

## 2020-05-23 ENCOUNTER — Other Ambulatory Visit: Payer: Self-pay

## 2020-05-23 ENCOUNTER — Encounter: Payer: Self-pay | Admitting: Gastroenterology

## 2020-05-23 ENCOUNTER — Ambulatory Visit (AMBULATORY_SURGERY_CENTER): Payer: PRIVATE HEALTH INSURANCE | Admitting: Gastroenterology

## 2020-05-23 VITALS — BP 128/75 | HR 84 | Temp 87.4°F | Resp 20 | Ht 72.0 in | Wt 247.0 lb

## 2020-05-23 DIAGNOSIS — K219 Gastro-esophageal reflux disease without esophagitis: Secondary | ICD-10-CM

## 2020-05-23 DIAGNOSIS — K2970 Gastritis, unspecified, without bleeding: Secondary | ICD-10-CM

## 2020-05-23 DIAGNOSIS — K2950 Unspecified chronic gastritis without bleeding: Secondary | ICD-10-CM

## 2020-05-23 DIAGNOSIS — K319 Disease of stomach and duodenum, unspecified: Secondary | ICD-10-CM

## 2020-05-23 DIAGNOSIS — K449 Diaphragmatic hernia without obstruction or gangrene: Secondary | ICD-10-CM | POA: Diagnosis not present

## 2020-05-23 DIAGNOSIS — K317 Polyp of stomach and duodenum: Secondary | ICD-10-CM

## 2020-05-23 MED ORDER — SODIUM CHLORIDE 0.9 % IV SOLN: 500.0000 mL | Freq: Once | INTRAVENOUS | Status: AC

## 2020-05-23 NOTE — Progress Notes (Signed)
VS taken by C.W. 

## 2020-05-23 NOTE — Discharge Instructions (Signed)
Resume previous medications. RESUME PLAVIX 05/25/2020 Handouts on findings given to patient. Await pathology for final recommendations.  YOU HAD AN ENDOSCOPIC PROCEDURE TODAY AT [facility]:   Refer to the procedure report that was given to you for any specific questions about what was found during the examination.  If the procedure report does not answer your questions, please call your gastroenterologist to clarify.  If you requested that your care partner not be given the details of your procedure findings, then the procedure report has been included in a sealed envelope for you to review at your convenience later.  YOU SHOULD EXPECT: Some feelings of bloating in the abdomen. Passage of more gas than usual.  Walking can help get rid of the air that was put into your GI tract during the procedure and reduce the bloating. If you had a lower endoscopy (such as a colonoscopy or flexible sigmoidoscopy) you may notice spotting of blood in your stool or on the toilet paper. If you underwent a bowel prep for your procedure, you may not have a normal bowel movement for a few days.  Please Note:  You might notice some irritation and congestion in your nose or some drainage.  This is from the oxygen used during your procedure.  There is no need for concern and it should clear up in a day or so.  SYMPTOMS TO REPORT IMMEDIATELY:    Following upper endoscopy (EGD)  Vomiting of blood or coffee ground material  New chest pain or pain under the shoulder blades  Painful or persistently difficult swallowing  New shortness of breath  Fever of 100F or higher  Black, tarry-looking stools  For urgent or emergent issues, a gastroenterologist can be reached at any hour by calling [phone]. Do not use MyChart messaging for urgent concerns.    DIET:  We do recommend a small meal at first, but then you may proceed to your regular diet.  Drink plenty of fluids but you should avoid alcoholic  beverages for 24 hours.  ACTIVITY:  You should plan to take it easy for the rest of today and you should NOT DRIVE or use heavy machinery until tomorrow (because of the sedation medicines used during the test).    FOLLOW UP: Our staff will call the number listed on your records 48-72 hours following your procedure to check on you and address any questions or concerns that you may have regarding the information given to you following your procedure. If we do not reach you, we will leave a message.  We will attempt to reach you two times.  During this call, we will ask if you have developed any symptoms of COVID 19. If you develop any symptoms (ie: fever, flu-like symptoms, shortness of breath, cough etc.) before then, please call [phone].  If you test positive for Covid 19 in the 2 weeks post procedure, please call and report this information to [place].    If any biopsies were taken you will be contacted by phone or by letter within the next 1-3 weeks.  Please call us at [phone] if you have not heard about the biopsies in 3 weeks.    SIGNATURES/CONFIDENTIALITY: You and/or your care partner have signed paperwork which will be entered into your electronic medical record.  These signatures attest to the fact that that the information above on your After Visit Summary has been reviewed and is understood.  Full responsibility of the confidentiality of this discharge information lies  with you and/or your care-partner.

## 2020-05-23 NOTE — Progress Notes (Signed)
PT taken to PACU. Monitors in place. VSS. Report given to RN. 

## 2020-05-23 NOTE — Op Note (Signed)
[facility] Patient Name: Aaron Reese Procedure Date: 05/23/2020 9:38 AM MRN: [id number] Endoscopist: Jackquline Denmark , MD Age: 54 Referring MD:  Date of Birth: 12-Jan-1966 Gender: Male Account #: [id number] Procedure:                Upper GI endoscopy Indications:              #1. GERD                           #2. Atypical chest pains. Pt with H/O CAD s/p                            PCI/DES to LAD 03/2018 on ASA/plavix (followed by                            Dr. Bettina Gavia), HTN, HLD. Neg CT abdo/pevis 09/2016.                           #3. Recently diagnosed with sleep apnea on CPAP. Medicines:                Monitored Anesthesia Care Procedure:                Pre-Anesthesia Assessment:                           - Prior to the procedure, a History and Physical                            was performed, and patient medications and                            allergies were reviewed. The patient's tolerance of                            previous anesthesia was also reviewed. The risks                            and benefits of the procedure and the sedation                            options and risks were discussed with the patient.                            All questions were answered, and informed consent                            was obtained. Prior Anticoagulants: The patient has                            taken Plavix (clopidogrel), last dose was 5 days                            prior to procedure. ASA Grade Assessment: II - A  patient with mild systemic disease. After reviewing                            the risks and benefits, the patient was deemed in                            satisfactory condition to undergo the procedure.                           After obtaining informed consent, the endoscope was                            passed under direct vision. Throughout the                            procedure, the patient's blood pressure, pulse,  and                            oxygen saturations were monitored continuously. The                            Endoscope was introduced through the mouth, and                            advanced to the second part of duodenum. The upper                            GI endoscopy was accomplished without difficulty.                            The patient tolerated the procedure well. Scope In: Scope Out: Findings:                 The examined esophagus was normal with well-defined                            Z-line at 40 cm. Examined by NBI. Biopsies were                            obtained from the proximal and distal esophagus                            with cold forceps for histology to r/o eosinophilic                            esophagitis.                           A small transient hiatal hernia was present.                           Localized mild inflammation characterized by                            erythema was  found in the gastric antrum. Biopsies                            were taken with a cold forceps for histology.                           6-8, 2 to 4 mm sessile polyps with no bleeding and                            no stigmata of recent bleeding were found in the                            gastric fundus and in the gastric body. 3 polyps                            were removed with a cold biopsy forceps. Resection                            and retrieval were complete.                           The examined duodenum was normal. Complications:            No immediate complications. Estimated Blood Loss:     Estimated blood loss: none. Impression:               - Small hiatal hernia.                           - Gastritis. Biopsied.                           - A few gastric polyps. Resected and retrieved x 3. Recommendation:           - Patient has a contact number available for                            emergencies. The signs and symptoms of potential                             delayed complications were discussed with the                            patient. Return to normal activities tomorrow.                            Written discharge instructions were provided to the                            patient.                           - Resume previous diet.                           - Continue present medications.                           -  Await pathology results.                           - Resume Plavix from 12/8 onwards.                           - Return to GI clinic in 12 weeks. If still with                            problems, trial of low-dose amitriptyline, followed                            by esophageal manometry if needed.                           - The findings and recommendations were discussed                            with the patient's mom Fraser Din) Jackquline Denmark, MD 05/23/2020 10:05:07 AM This report has been signed electronically.

## 2020-05-23 NOTE — Progress Notes (Signed)
Called to room to assist during endoscopic procedure.  Patient ID and intended procedure confirmed with present staff. Received instructions for my participation in the procedure from the performing physician.  

## 2020-05-25 ENCOUNTER — Telehealth: Payer: Self-pay | Admitting: *Deleted

## 2020-05-25 NOTE — Telephone Encounter (Signed)
  Follow up Call-  Call back number 05/23/2020  Post procedure Call Back phone  # [phone]  Permission to leave phone message Yes  Some recent data might be hidden     Patient questions:  Do you have a fever, pain , or abdominal swelling? No. Pain Score  0 *  Have you tolerated food without any problems? Yes.    Have you been able to return to your normal activities? Yes.    Do you have any questions about your discharge instructions: Diet   No. Medications  No. Follow up visit  No.  Do you have questions or concerns about your Care? No.  Actions: * If pain score is 4 or above: No action needed, pain <4.  1. Have you developed a fever since your procedure? no  2.   Have you had an respiratory symptoms (SOB or cough) since your procedure? no  3.   Have you tested positive for COVID 19 since your procedure no  4.   Have you had any family members/close contacts diagnosed with the COVID 19 since your procedure?  no   If yes to any of these questions please route to Joylene John, RN and Joella Prince, RN

## 2020-05-27 ENCOUNTER — Encounter: Payer: Self-pay | Admitting: Gastroenterology

## 2020-07-29 IMAGING — CT CT CHEST W/O CM
2 of 3 series · 15 of 36 positions shown, 18 images · non-contrast
Comparison: Chest radiograph 03/31/2018

CLINICAL DATA: Followup atelectasis. Cough and congestion for 3
months

EXAM:
CT CHEST WITHOUT CONTRAST
TECHNIQUE: Multidetector CT imaging of the chest was performed following the
standard protocol without IV contrast.

[Series 2: thorax · axial · 0.76mm/px · z∈[-140,+108]mm · 12 of 146 slices shown, 15 images]
[im 11/146  mediastinal]
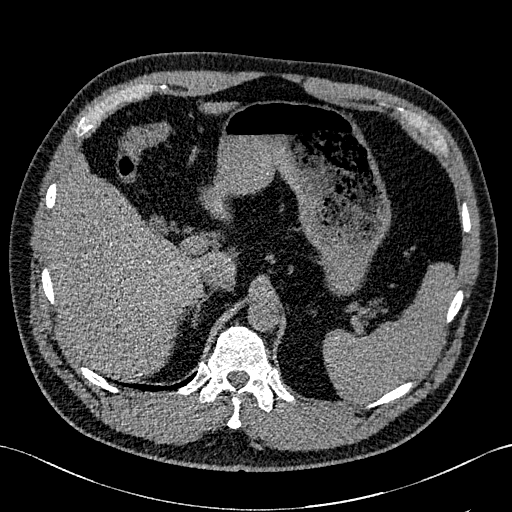
[im 11/146  lung]
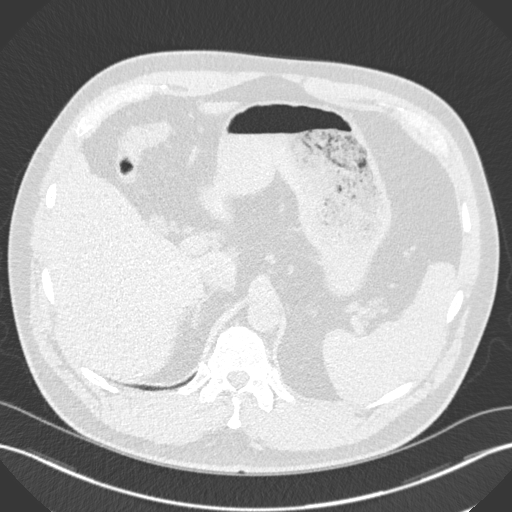
[im 22/146  lung]
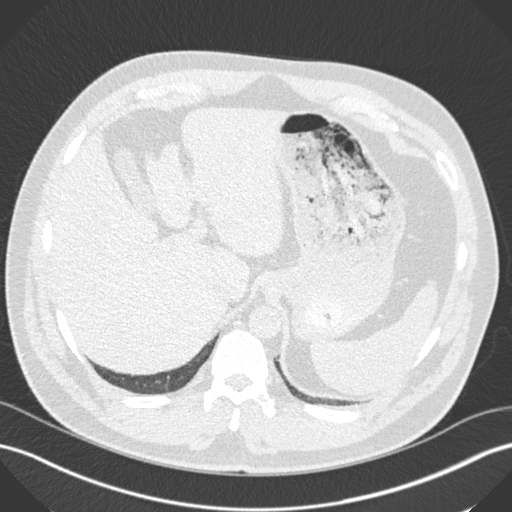
[im 33/146  lung]
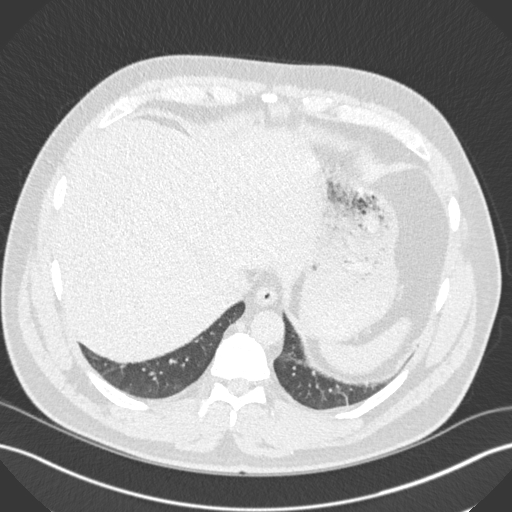
[im 43/146  lung]
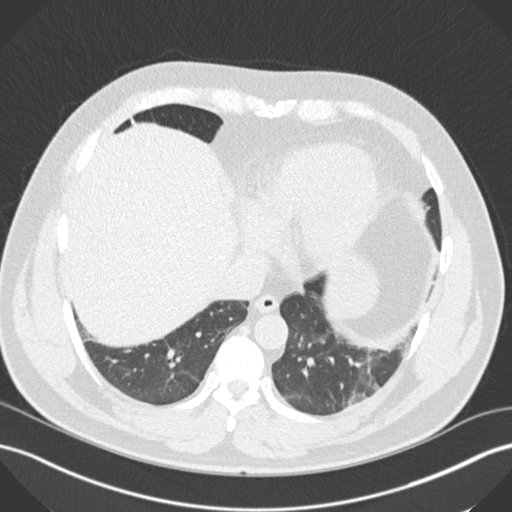
[im 54/146  mediastinal]
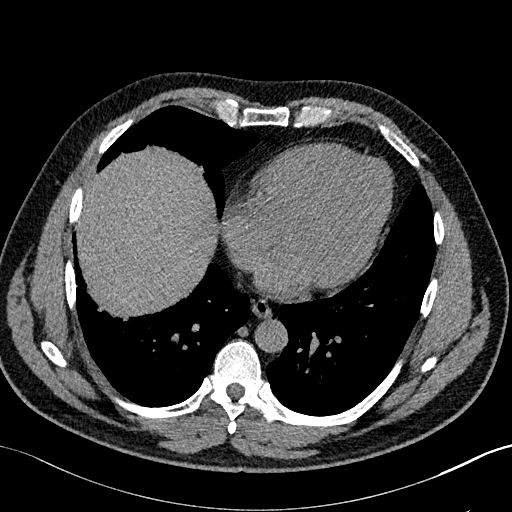
[im 54/146  lung]
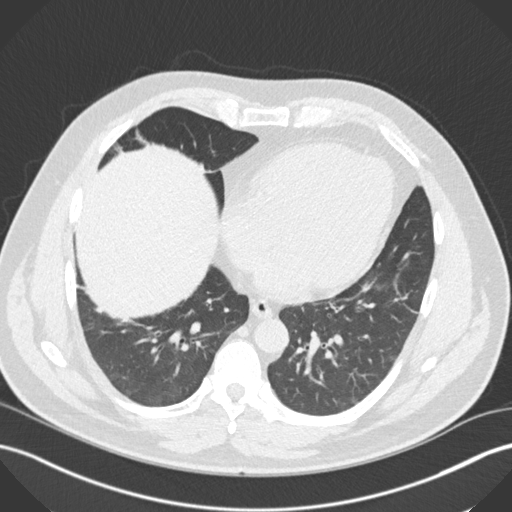
[im 65/146  lung]
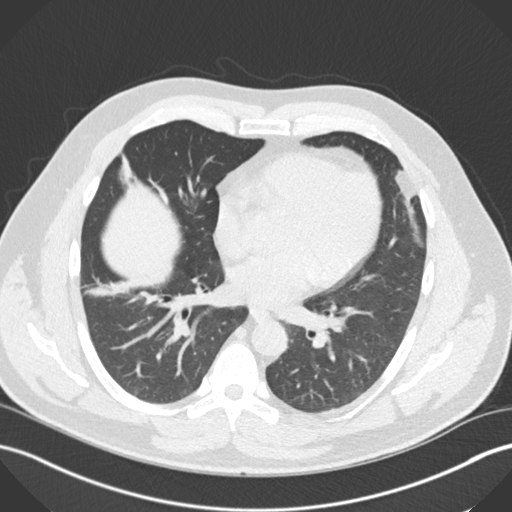
[im 81/146  lung]
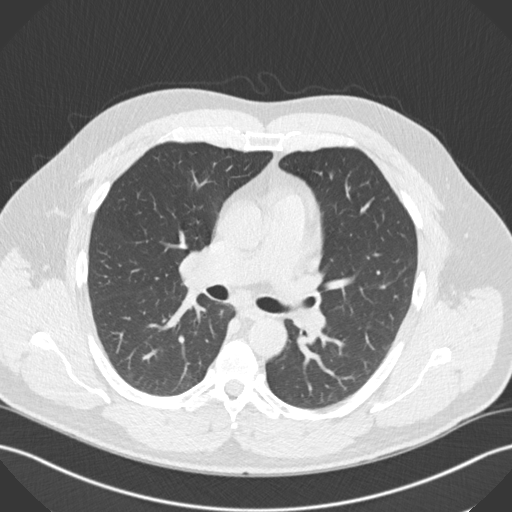
[im 92/146  lung]
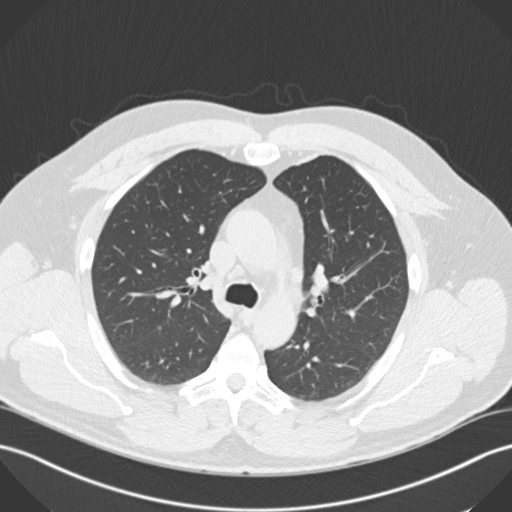
[im 103/146  mediastinal]
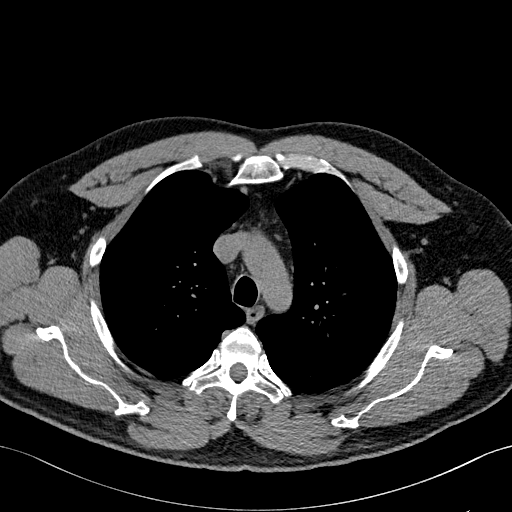
[im 103/146  lung]
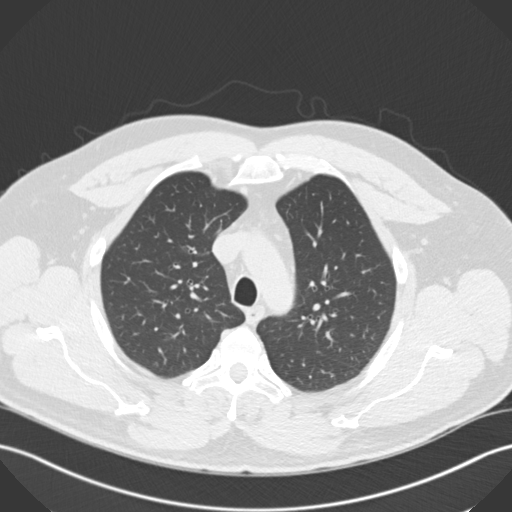
[im 113/146  lung]
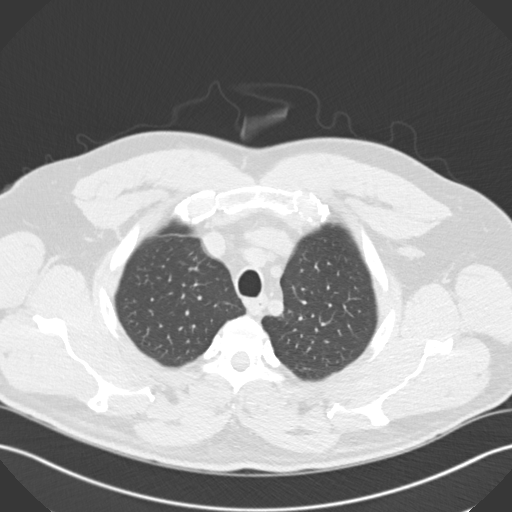
[im 124/146  lung]
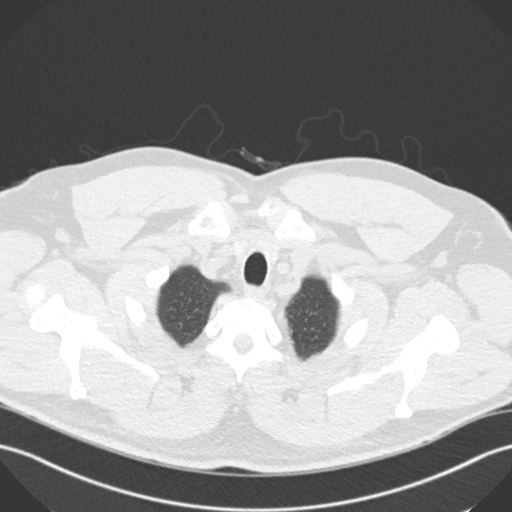
[im 135/146  lung]
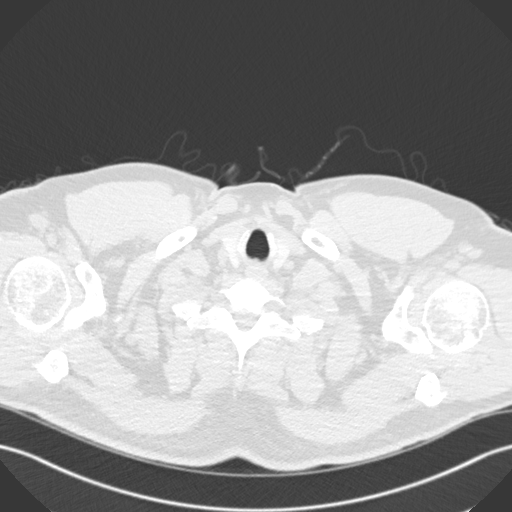

[Series 5: coronal · coronal · 0.59mm/px · 3 of 145 slices shown]
[im 29/145  lung]
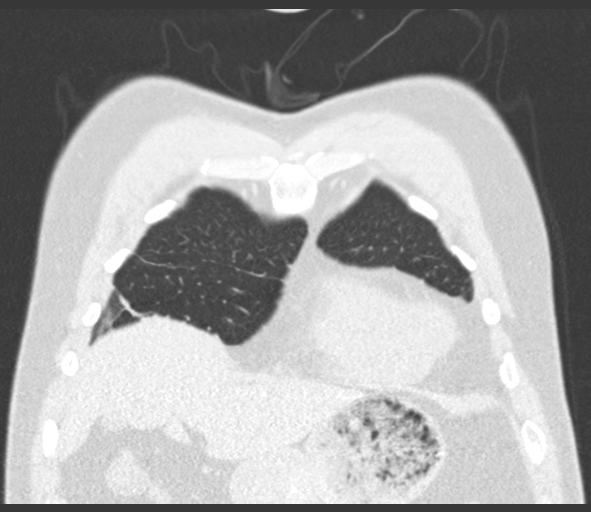
[im 58/145  lung]
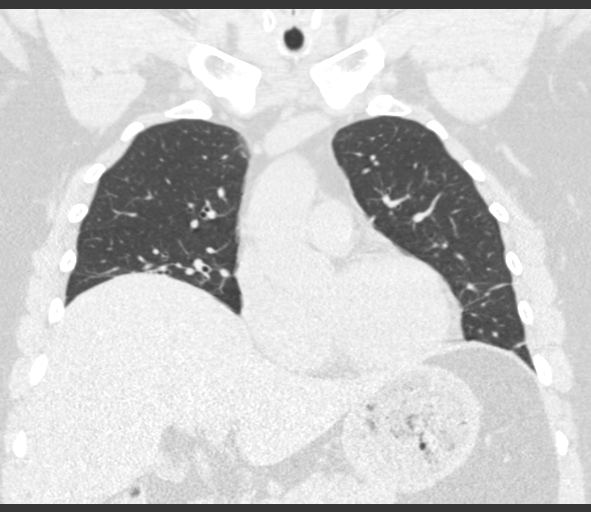
[im 87/145  lung]
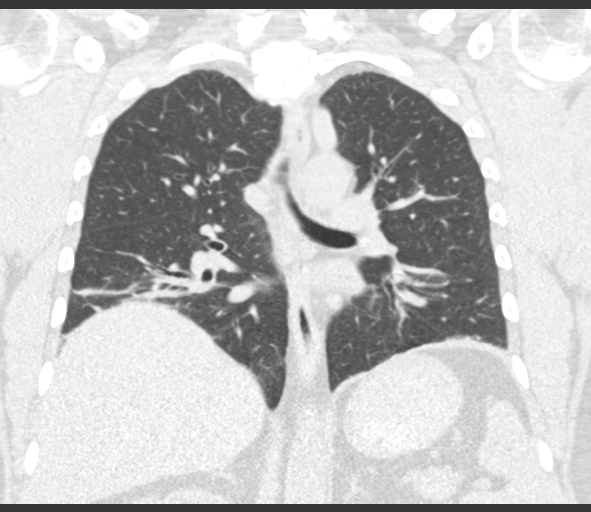

[15 of 36 positions shown; findings below may reference images not displayed]

FINDINGS: Cardiovascular: Normal heart size. Aortic atherosclerosis.
Calcification in the LAD and left circumflex coronary artery
identified.

Mediastinum/Nodes: Normal appearance of the thyroid gland. The
trachea appears patent and is midline. No supraclavicular or
axillary adenopathy. No mediastinal or hilar adenopathy.

Lungs/Pleura: No pleural effusions. Subsegmental atelectasis versus
scar identified in the right middle lobe, lingula and both lower
lobes. No airspace consolidation. No suspicious pulmonary nodule or
mass identified.

Upper Abdomen: No acute abnormality.

Musculoskeletal: No chest wall mass or suspicious bone lesions
identified.
IMPRESSION: 1. Bilateral lower lobe, lingula and right middle lobe scar versus
subsegmental atelectasis is identified. When compared with CT from
10/08/2016 this appears slightly progressive.
2. No suspicious mass.
3.  Aortic Atherosclerosis ([id number]).
4. Coronary artery atherosclerotic calcifications.

## 2020-10-20 DIAGNOSIS — G4730 Sleep apnea, unspecified: Secondary | ICD-10-CM | POA: Insufficient documentation

## 2020-11-09 ENCOUNTER — Other Ambulatory Visit: Payer: Self-pay

## 2020-11-09 ENCOUNTER — Encounter: Payer: Self-pay | Admitting: Cardiology

## 2020-11-09 ENCOUNTER — Ambulatory Visit: Payer: PRIVATE HEALTH INSURANCE | Admitting: Cardiology

## 2020-11-09 DIAGNOSIS — I25118 Atherosclerotic heart disease of native coronary artery with other forms of angina pectoris: Secondary | ICD-10-CM

## 2020-11-09 DIAGNOSIS — I10 Essential (primary) hypertension: Secondary | ICD-10-CM | POA: Diagnosis not present

## 2020-11-09 NOTE — Progress Notes (Signed)
Cardiology Office Note:    Date:  11/09/2020   ID:  Aaron Reese, DOB December 29, 1965, MRN [id number]  PCP:  Janalyn Rouse, FNP  Cardiologist:  Shirlee More, MD    Referring MD: Janalyn Rouse, FNP    ASSESSMENT:    1. Coronary artery disease of native artery of native heart with stable angina pectoris ([place])   2. Benign essential hypertension    PLAN:    In order of problems listed above:  1. Aaron Reese is doing well stable after PCI no anginal discomfort continue treatment clopidogrel beta-blocker high intensity statin 2. Stable BP at target continue treatment he is on ACE inhibitor with CAD 3. Hyperlipidemia stable continue high intensity statin await labs if LDL is below 70 combined therapy be appropriate with PCSK9 inhibitor or Zetia   Next appointment: 1 year   Medication Adjustments/Labs and Tests Ordered: Current medicines are reviewed at length with the patient today.  Concerns regarding medicines are outlined above.  Orders Placed This Encounter  Procedures  . EKG 12-Lead   No orders of the defined types were placed in this encounter.   Chief Complaint  Patient presents with  . Follow-up  . Coronary Artery Disease    History of Present Illness:    Aaron Reese is a 55 y.o. male with a hx of CAD with PCI and stent 2 drug-eluting 04/08/2018 left anterior descending coronary artery and hyperlipidemia last seen 11/24/2019.  Compliance with diet, lifestyle and medications: Yes  Is doing well no angina dyspnea palpitation or syncope and tells me labs-previously showed lipids at target copy requested.  He underwent a myocardial perfusion test performed 11/24/2019 with Lexiscan showing normal perfusion normal function EF 59%  Left heart cath 04/08/2018: Coronary Diagrams   Diagnostic Dominance: Right    Intervention      Past Medical History:  Diagnosis Date  . Abnormal finding on lung imaging 03/31/2018   October 2019 CT chest  showing normal  pulmonary parenchyma in the upper lobes but in the bases there is atelectasis and some airway thickening with surrounding groundglass.  Compared to in April 2018 CT scan of his abdomen this does appear to be progressive.  10/07/2018-chest x-ray ([facility])- chronic appearing lingular opacity, no acute infiltrate edema mass or effusion otherwise, chr  . Anxiety 03/24/2018  . Benign essential hypertension 03/24/2018  . CAD (coronary artery disease)    s/p DES to pLAD & mLAD 04/08/18  . Chest pain in adult 03/24/2018  . Chlorine gas exposure 10/08/2018  . Depression 03/24/2018  . Frequent headaches 03/24/2018  . GAD (generalized anxiety disorder) 04/05/2018  . GERD (gastroesophageal reflux disease) 03/24/2018  . Hypercholesterolemia 03/24/2018  . Hypotestosteronemia 03/24/2018  . OSA (obstructive sleep apnea) 10/08/2018   Split-night sleep study January 2020 AHI 24.3 with optimal control on CPAP 12 cm H2O.   . Shortness of breath 10/08/2018   10/08/2018-CT chest without contrast- lingular scarring, no confluent opacities or effusions otherwise  12/10/2018-pulmonary function test- FVC 4.15 (79% predicted), postbronchodilator ratio 84, postbronchodilator FEV1 3.72 (91% predicted), no bronchodilator response, mid flow reversibility, DLCO 94  . Sleep apnea   . Substernal chest pain 03/24/2018    Past Surgical History:  Procedure Laterality Date  . CARDIAC CATHETERIZATION    . COLONOSCOPY  11/23/2016   Colon polyps. Diverticulosis of colon. Internal hemorrhoids.   . CORONARY STENT INTERVENTION N/A 04/08/2018   Procedure: CORONARY STENT INTERVENTION;  Surgeon: Jettie Booze, MD;  Location: [facility];  Service: Cardiovascular;  Laterality: N/A;  . LEFT HEART CATH AND CORONARY ANGIOGRAPHY N/A 04/08/2018   Procedure: LEFT HEART CATH AND CORONARY ANGIOGRAPHY;  Surgeon: Jettie Booze, MD;  Location: [facility];  Service: Cardiovascular;  Laterality: N/A;  . UPPER  GASTROINTESTINAL ENDOSCOPY      Current Medications: Current Meds  Medication Sig  . amitriptyline (ELAVIL) 25 MG tablet Take 25 mg by mouth at bedtime.  . clopidogrel (PLAVIX) 75 MG tablet TAKE ONE TABLET BY MOUTH DAILY WITH BREAKFAST  . diclofenac (VOLTAREN) 50 MG EC tablet Take 50 mg by mouth 3 (three) times daily.  Marland Kitchen docusate sodium (COLACE) 100 MG capsule Take 100 mg by mouth daily.  Marland Kitchen ezetimibe (ZETIA) 10 MG tablet Take 10 mg by mouth daily. Monday / Wednesday / Friday  . lansoprazole (PREVACID) 30 MG capsule Take 30 mg by mouth daily.  Marland Kitchen levocetirizine (XYZAL) 5 MG tablet Take 5 mg by mouth every evening.  Marland Kitchen lisinopril (PRINIVIL,ZESTRIL) 10 MG tablet Take 10 mg by mouth every evening.   . metoprolol succinate (TOPROL-XL) 50 MG 24 hr tablet Take 1 tablet (50 mg total) by mouth daily. Take with or immediately following a meal.  . nitroGLYCERIN (NITROSTAT) 0.4 MG SL tablet Place 1 tablet (0.4 mg total) under the tongue every 5 (five) minutes as needed.  . rosuvastatin (CRESTOR) 40 MG tablet Take 40 mg by mouth daily.  . [DISCONTINUED] dexlansoprazole (DEXILANT) 60 MG capsule Take 1 capsule (60 mg total) by mouth daily.   Current Facility-Administered Medications for the 11/09/20 encounter (Office Visit) with Richardo Priest, MD  Medication  . 0.9 %  sodium chloride infusion     Allergies:   Citalopram, Sertraline, and Venlafaxine   Social History   Socioeconomic History  . Marital status: Married    Spouse name: Not on file  . Number of children: Not on file  . Years of education: Not on file  . Highest education level: Not on file  Occupational History  . Not on file  Tobacco Use  . Smoking status: Never Smoker  . Smokeless tobacco: Never Used  Vaping Use  . Vaping Use: Never used  Substance and Sexual Activity  . Alcohol use: Not Currently  . Drug use: Not Currently  . Sexual activity: Not on file  Other Topics Concern  . Not on file  Social History Narrative  .  Not on file   Social Determinants of Health   Financial Resource Strain: Not on file  Food Insecurity: Not on file  Transportation Needs: Not on file  Physical Activity: Not on file  Stress: Not on file  Social Connections: Not on file     Family History: The patient's family history includes ALS in his father; Hypertension in his mother; Stroke in his brother. There is no history of Heart disease, Cancer, Diabetes, Colon cancer, Esophageal cancer, Stomach cancer, or Rectal cancer. ROS:   Please see the history of present illness.    All other systems reviewed and are negative.  EKGs/Labs/Other Studies Reviewed:    The following studies were reviewed today:  EKG:  EKG ordered today and personally reviewed.  The ekg ordered today demonstrates sinus rhythm and significant Q waves normal EKG  Recent Labs: Requested [facility]  Physical Exam:    VS:  There were no vitals taken for this visit.    Wt Readings from Last 3 Encounters:  05/23/20 247 lb (112 kg)  05/06/20 247 lb 2 oz (  112.1 kg)  12/22/19 253 lb (114.8 kg)     GEN:  Well nourished, well developed in no acute distress HEENT: Normal NECK: No JVD; No carotid bruits LYMPHATICS: No lymphadenopathy CARDIAC: RRR, no murmurs, rubs, gallops RESPIRATORY:  Clear to auscultation without rales, wheezing or rhonchi  ABDOMEN: Soft, non-tender, non-distended MUSCULOSKELETAL:  No edema; No deformity  SKIN: Warm and dry NEUROLOGIC:  Alert and oriented x 3 PSYCHIATRIC:  Normal affect    Signed, Shirlee More, MD  11/09/2020 4:19 PM    [facility]

## 2020-11-09 NOTE — Patient Instructions (Signed)
Medication Instructions:  Your physician recommends that you continue on your current medications as directed. Please refer to the Current Medication list given to you today.  *If you need a refill on your cardiac medications before your next appointment, please call your pharmacy*   Lab Work: None If you have labs (blood work) drawn today and your tests are completely normal, you will receive your results only by: Marland Kitchen MyChart Message (if you have MyChart) OR . A paper copy in the mail If you have any lab test that is abnormal or we need to change your treatment, we will call you to review the results.   Testing/Procedures: NOne   Follow-Up: At [facility], you and your health needs are our priority.  As part of our continuing mission to provide you with exceptional heart care, we have created designated Provider Care Teams.  These Care Teams include your primary Cardiologist (physician) and Advanced Practice Providers (APPs -  Physician Assistants and Nurse Practitioners) who all work together to provide you with the care you need, when you need it.  We recommend signing up for the patient portal called "MyChart".  Sign up information is provided on this After Visit Summary.  MyChart is used to connect with patients for Virtual Visits (Telemedicine).  Patients are able to view lab/test results, encounter notes, upcoming appointments, etc.  Non-urgent messages can be sent to your provider as well.   To learn more about what you can do with MyChart, go to [email or web address].    Your next appointment:   1 year(s)  The format for your next appointment:   In Person  Provider:   Shirlee More, MD   Other Instructions

## 2021-02-03 IMAGING — CT CT CHEST WITHOUT CONTRAST
2 of 3 series · 15 of 36 positions shown, 18 images · non-contrast
Comparison: 04/02/2018

CLINICAL DATA: Chest pain for 2-3 months.

EXAM:
CT CHEST WITHOUT CONTRAST
TECHNIQUE: Multidetector CT imaging of the chest was performed following the
standard protocol without IV contrast.

[Series 2: thorax · axial · 0.76mm/px · z∈[-313,-55]mm · 12 of 153 slices shown, 15 images]
[im 12/153  mediastinal]
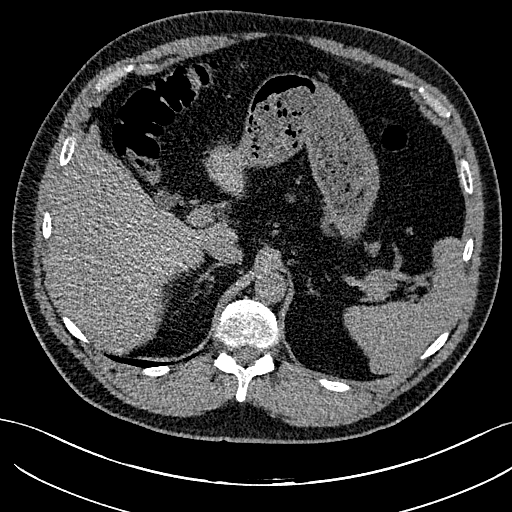
[im 12/153  lung]
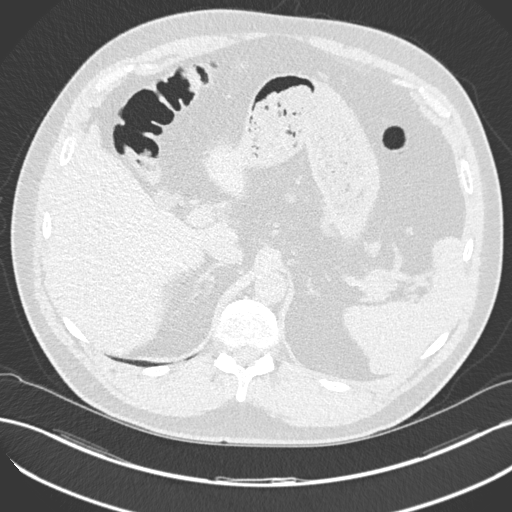
[im 23/153  lung]
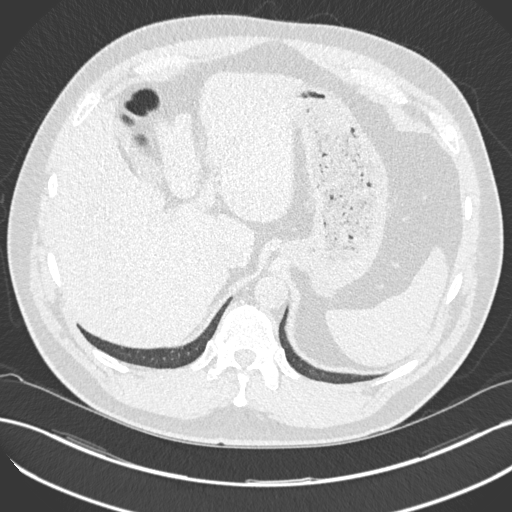
[im 34/153  lung]
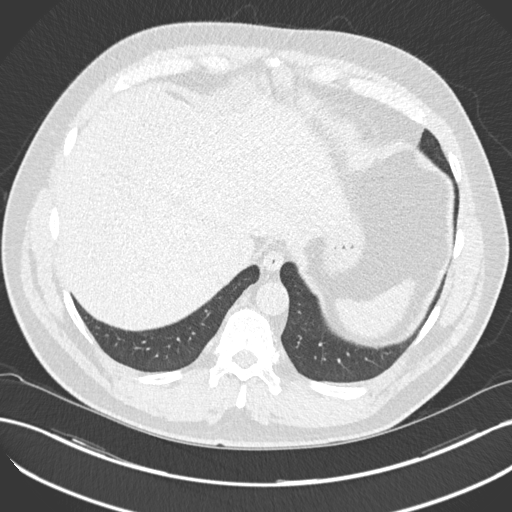
[im 46/153  lung]
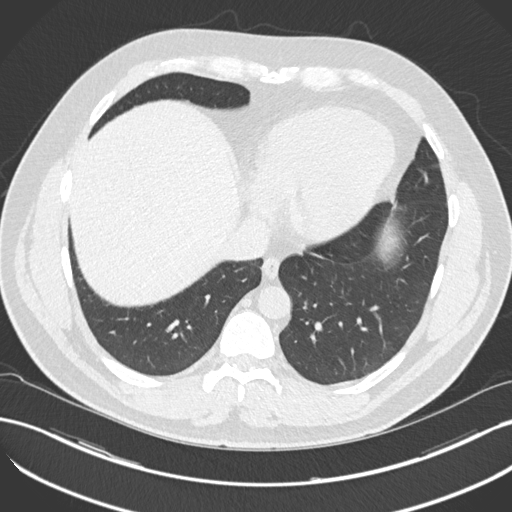
[im 57/153  mediastinal]
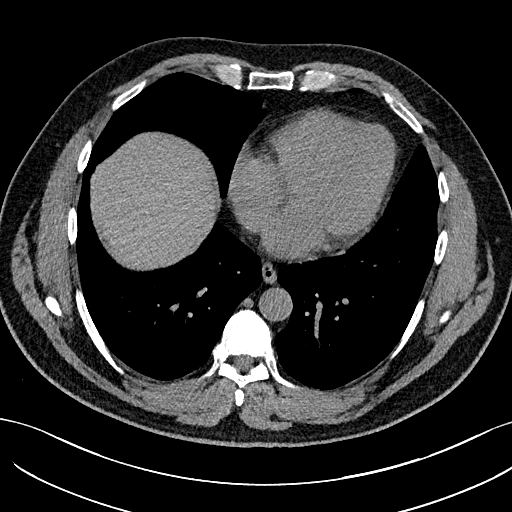
[im 57/153  lung]
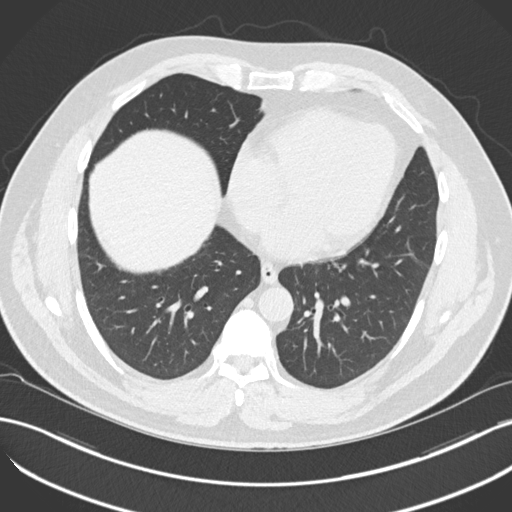
[im 68/153  lung]
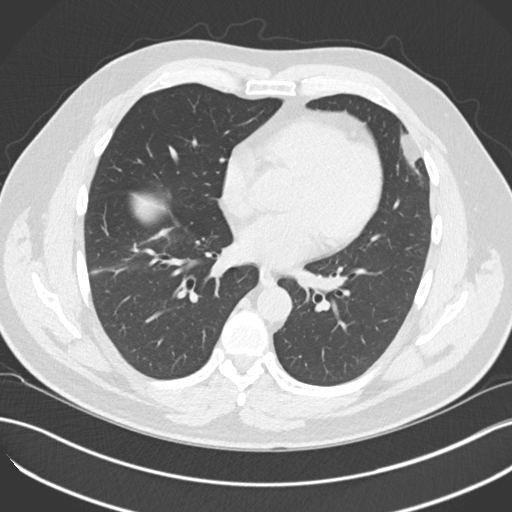
[im 85/153  lung]
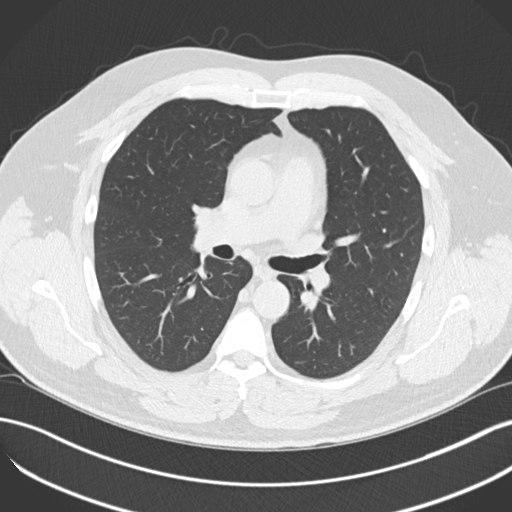
[im 96/153  lung]
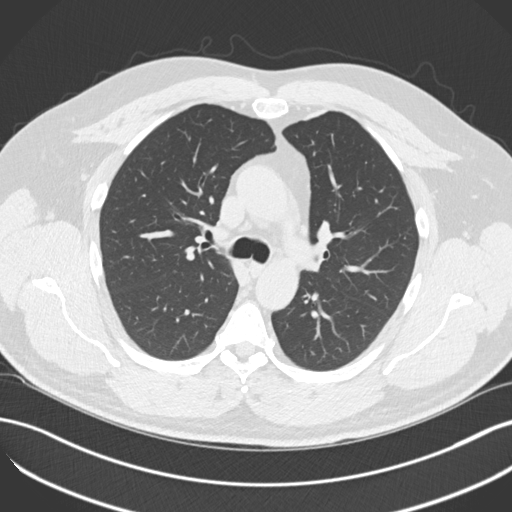
[im 107/153  mediastinal]
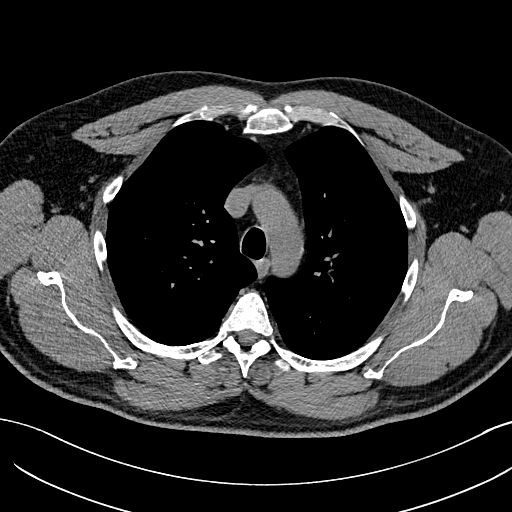
[im 107/153  lung]
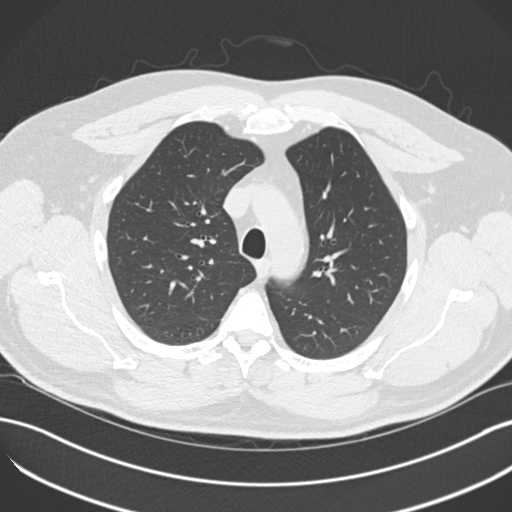
[im 119/153  lung]
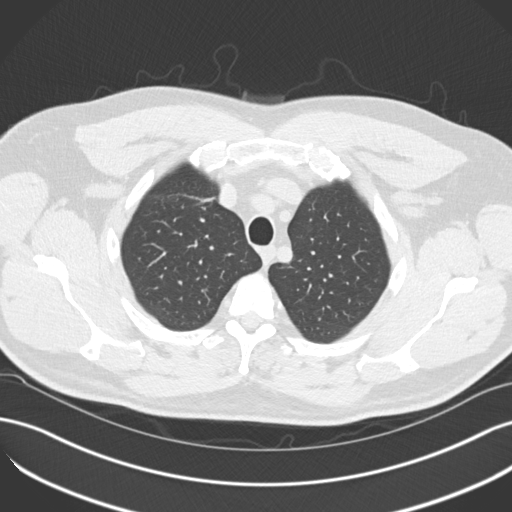
[im 130/153  lung]
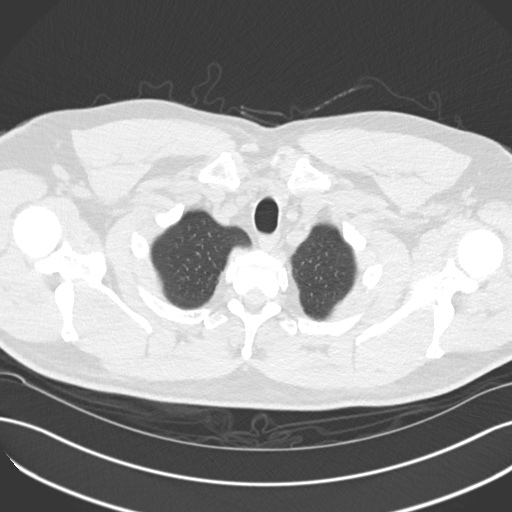
[im 141/153  lung]
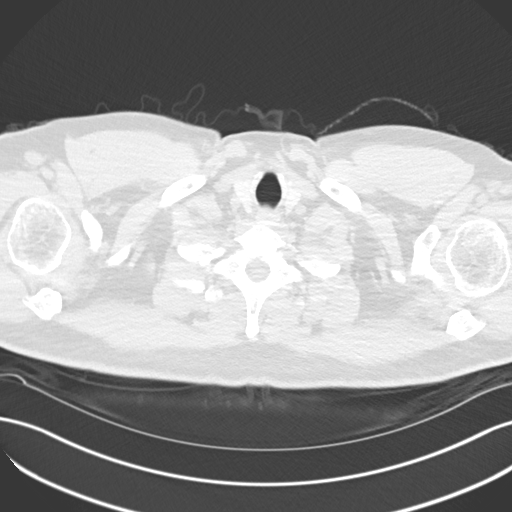

[Series 5: coronal · coronal · 0.65mm/px · 3 of 163 slices shown]
[im 33/163  lung]
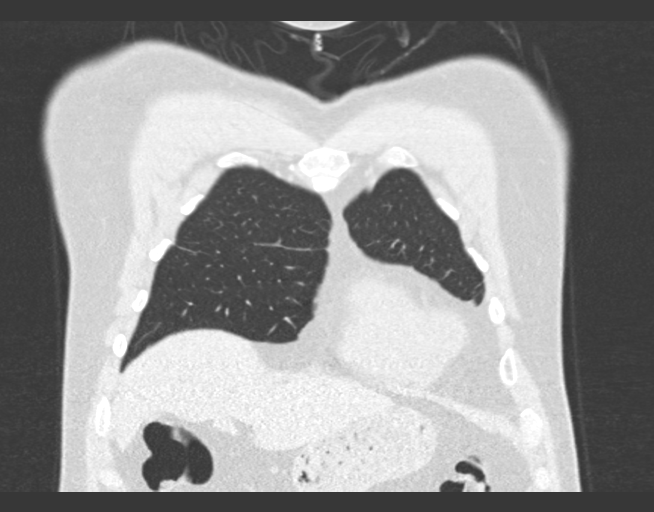
[im 65/163  lung]
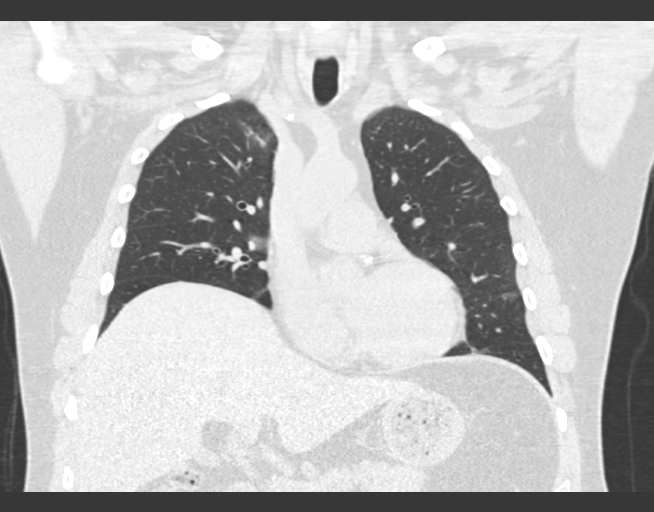
[im 98/163  lung]
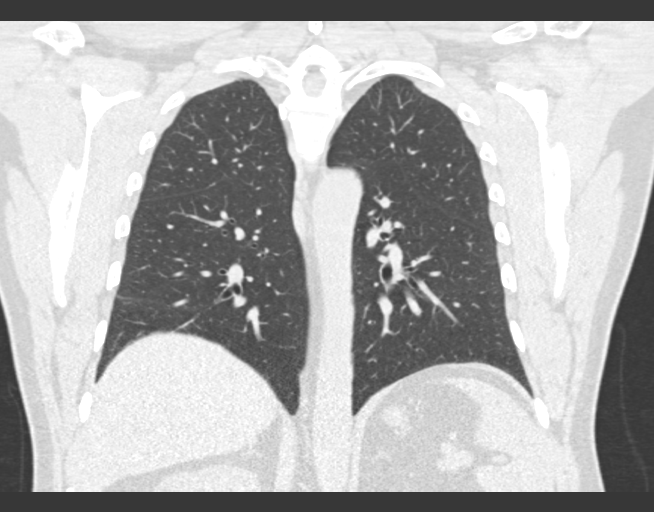

[15 of 36 positions shown; findings below may reference images not displayed]

FINDINGS: Cardiovascular: Calcifications in the left anterior descending and
left circumflex coronary arteries. Stent within the left anterior
descending coronary artery. Heart is normal size. Aorta is normal
caliber with scattered aortic calcifications.

Mediastinum/Nodes: No mediastinal, hilar, or axillary adenopathy.

Lungs/Pleura: Stable lingular density most compatible with scarring.
No confluent opacities or effusions otherwise.

Upper Abdomen: Imaging into the upper abdomen shows no acute
findings.

Musculoskeletal: Chest wall soft tissues are unremarkable. No acute
bony abnormality.
IMPRESSION: Lingular scarring.

Coronary artery disease, prior LAD stenting.

## 2021-12-23 NOTE — Progress Notes (Unsigned)
Cardiology Office Note:    Date:  12/23/2021   ID:  Aaron Reese, DOB 08/03/1965, MRN [id number]  PCP:  Janalyn Rouse, FNP  Cardiologist:  Shirlee More, MD    Referring MD: Janalyn Rouse, FNP    ASSESSMENT:    No diagnosis found. PLAN:    In order of problems listed above:  ***   Next appointment: ***   Medication Adjustments/Labs and Tests Ordered: Current medicines are reviewed at length with the patient today.  Concerns regarding medicines are outlined above.  No orders of the defined types were placed in this encounter.  No orders of the defined types were placed in this encounter.   No chief complaint on file.   History of Present Illness:    Aaron Reese is a 56 y.o. male with a hx of CAD with PCI and 2 drug-eluting stents left anterior descending coronary artery 04/08/2018 and hyperlipidemia last seen 11/09/2021. Compliance with diet, lifestyle and medications: ***  He underwent a myocardial perfusion test performed 11/24/2019 with Lexiscan showing normal perfusion normal function EF 59%   Left heart cath 04/08/2018: Coronary Diagrams     Diagnostic Dominance: Right      Intervention        Past Medical History:  Diagnosis Date   Abnormal finding on lung imaging 03/31/2018   October 2019 CT chest  showing normal pulmonary parenchyma in the upper lobes but in the bases there is atelectasis and some airway thickening with surrounding groundglass.  Compared to in April 2018 CT scan of his abdomen this does appear to be progressive.  10/07/2018-chest x-ray ([facility])- chronic appearing lingular opacity, no acute infiltrate edema mass or effusion otherwise, chr   Anxiety 03/24/2018   Benign essential hypertension 03/24/2018   CAD (coronary artery disease)    s/p DES to pLAD & mLAD 04/08/18   Chest pain in adult 03/24/2018   Chlorine gas exposure 10/08/2018   Depression 03/24/2018   Frequent headaches 03/24/2018   GAD (generalized  anxiety disorder) 04/05/2018   GERD (gastroesophageal reflux disease) 03/24/2018   Hypercholesterolemia 03/24/2018   Hypotestosteronemia 03/24/2018   OSA (obstructive sleep apnea) 10/08/2018   Split-night sleep study January 2020 AHI 24.3 with optimal control on CPAP 12 cm H2O.    Shortness of breath 10/08/2018   10/08/2018-CT chest without contrast- lingular scarring, no confluent opacities or effusions otherwise  12/10/2018-pulmonary function test- FVC 4.15 (79% predicted), postbronchodilator ratio 84, postbronchodilator FEV1 3.72 (91% predicted), no bronchodilator response, mid flow reversibility, DLCO 94   Sleep apnea    Substernal chest pain 03/24/2018    Past Surgical History:  Procedure Laterality Date   CARDIAC CATHETERIZATION     COLONOSCOPY  11/23/2016   Colon polyps. Diverticulosis of colon. Internal hemorrhoids.    CORONARY STENT INTERVENTION N/A 04/08/2018   Procedure: CORONARY STENT INTERVENTION;  Surgeon: Jettie Booze, MD;  Location: [facility];  Service: Cardiovascular;  Laterality: N/A;   LEFT HEART CATH AND CORONARY ANGIOGRAPHY N/A 04/08/2018   Procedure: LEFT HEART CATH AND CORONARY ANGIOGRAPHY;  Surgeon: Jettie Booze, MD;  Location: [facility];  Service: Cardiovascular;  Laterality: N/A;   UPPER GASTROINTESTINAL ENDOSCOPY      Current Medications: No outpatient medications have been marked as taking for the 12/25/21 encounter (Appointment) with Richardo Priest, MD.   Current Facility-Administered Medications for the 12/25/21 encounter (Appointment) with Richardo Priest, MD  Medication   0.9 %  sodium chloride infusion  Allergies:   Citalopram, Sertraline, and Venlafaxine   Social History   Socioeconomic History   Marital status: Married    Spouse name: Not on file   Number of children: Not on file   Years of education: Not on file   Highest education level: Not on file  Occupational History   Not on file  Tobacco Use   Smoking  status: Never   Smokeless tobacco: Never  Vaping Use   Vaping Use: Never used  Substance and Sexual Activity   Alcohol use: Not Currently   Drug use: Not Currently   Sexual activity: Not on file  Other Topics Concern   Not on file  Social History Narrative   Not on file   Social Determinants of Health   Financial Resource Strain: Not on file  Food Insecurity: Not on file  Transportation Needs: Not on file  Physical Activity: Not on file  Stress: Not on file  Social Connections: Not on file     Family History: The patient's ***family history includes ALS in his father; Hypertension in his mother; Stroke in his brother. There is no history of Heart disease, Cancer, Diabetes, Colon cancer, Esophageal cancer, Stomach cancer, or Rectal cancer. ROS:   Please see the history of present illness.    All other systems reviewed and are negative.  EKGs/Labs/Other Studies Reviewed:    The following studies were reviewed today:  EKG:  EKG ordered today and personally reviewed.  The ekg ordered today demonstrates ***  Recent Labs: No results found for requested labs within last 365 days.  Recent Lipid Panel No results found for: "CHOL", "TRIG", "HDL", "CHOLHDL", "VLDL", "LDLCALC", "LDLDIRECT"  Physical Exam:    VS:  There were no vitals taken for this visit.    Wt Readings from Last 3 Encounters:  05/23/20 247 lb (112 kg)  05/06/20 247 lb 2 oz (112.1 kg)  12/22/19 253 lb (114.8 kg)     GEN: *** Well nourished, well developed in no acute distress HEENT: Normal NECK: No JVD; No carotid bruits LYMPHATICS: No lymphadenopathy CARDIAC: ***RRR, no murmurs, rubs, gallops RESPIRATORY:  Clear to auscultation without rales, wheezing or rhonchi  ABDOMEN: Soft, non-tender, non-distended MUSCULOSKELETAL:  No edema; No deformity  SKIN: Warm and dry NEUROLOGIC:  Alert and oriented x 3 PSYCHIATRIC:  Normal affect    Signed, Shirlee More, MD  12/23/2021 4:11 PM    [facility]

## 2021-12-25 ENCOUNTER — Ambulatory Visit (INDEPENDENT_AMBULATORY_CARE_PROVIDER_SITE_OTHER): Payer: 59 | Admitting: Cardiology

## 2021-12-25 ENCOUNTER — Encounter: Payer: Self-pay | Admitting: Cardiology

## 2021-12-25 VITALS — BP 118/84 | HR 73 | Ht 72.0 in | Wt 258.0 lb

## 2021-12-25 DIAGNOSIS — I25118 Atherosclerotic heart disease of native coronary artery with other forms of angina pectoris: Secondary | ICD-10-CM

## 2021-12-25 DIAGNOSIS — I10 Essential (primary) hypertension: Secondary | ICD-10-CM | POA: Diagnosis not present

## 2021-12-25 DIAGNOSIS — E7800 Pure hypercholesterolemia, unspecified: Secondary | ICD-10-CM

## 2021-12-25 NOTE — Patient Instructions (Signed)

## 2022-02-14 ENCOUNTER — Telehealth: Payer: Self-pay

## 2022-02-14 ENCOUNTER — Ambulatory Visit (INDEPENDENT_AMBULATORY_CARE_PROVIDER_SITE_OTHER): Payer: 59 | Admitting: Gastroenterology

## 2022-02-14 ENCOUNTER — Encounter: Payer: Self-pay | Admitting: Gastroenterology

## 2022-02-14 VITALS — BP 130/86 | HR 67 | Ht 72.0 in | Wt 258.8 lb

## 2022-02-14 DIAGNOSIS — K219 Gastro-esophageal reflux disease without esophagitis: Secondary | ICD-10-CM | POA: Diagnosis not present

## 2022-02-14 DIAGNOSIS — Z86010 Personal history of colonic polyps: Secondary | ICD-10-CM

## 2022-02-14 DIAGNOSIS — R0789 Other chest pain: Secondary | ICD-10-CM

## 2022-02-14 MED ORDER — FLUTICASONE PROPIONATE 0.05 % EX CREA: TOPICAL_CREAM | Freq: Two times a day (BID) | CUTANEOUS | 2 refills | Status: DC

## 2022-02-14 MED ORDER — LANSOPRAZOLE 30 MG PO CPDR: 30.0000 mg | DELAYED_RELEASE_CAPSULE | Freq: Two times a day (BID) | ORAL | 4 refills | Status: DC

## 2022-02-14 MED ORDER — FAMOTIDINE 40 MG PO TABS: 40.0000 mg | ORAL_TABLET | Freq: Every day | ORAL | 4 refills | Status: DC

## 2022-02-14 MED ORDER — CLENPIQ 10-3.5-12 MG-GM -GM/175ML PO SOLN: 1.0000 | ORAL | 0 refills | Status: DC

## 2022-02-14 NOTE — Progress Notes (Signed)
Chief Complaint: Gastroesophageal reflux  Referring Provider:  Janalyn Rouse, FNP      ASSESSMENT AND PLAN;   #1. GERD -failed Protonix/Zantac. #2. Atypical chest pains. Pt with H/O CAD s/p PCI/DES to LAD 03/2018 on ASA/plavix (followed by Dr. Bettina Gavia), HTN, HLD. Neg CT abdo/pevis 09/2016. #3. Recently diagnosed sleep apnea on CPAP. #4. H/O polyps #4. Internal hemorrhoids vs small anal fissure  Plan: - Increase Pravacid '30mg'$  po BID #180 - Continue Pepcid '40mg'$  po qhs.90 - if still with UGI problems problems, 24hr/mano off PPI, then eval for TIFS - Colon with 2 day prep off plavix x 5 days after cardiology clearance from Dr Bettina Gavia. - fluticasone cream 0.05% generic 30g 1 bid PR x 10 days, 2 [facility]ills [facility]  If continued problems, would consider hemorrhoidal banding at later date - Nonpharmacologic means of reflux control. - Encouraged him to lose weight.  - Stop Voltaren   HPI:    Aaron Reese is a 56 y.o. male  With anxiety, CAD, HTN, OSA on CPAP, HLD  Still with reflux Better than before with Prevacid daily More in the evening.  No odynophagia or dysphagia.  Still has occasional substernal chest pains.  Had negative cardiac evaluation.  Dr. Bettina Gavia thinks the chest pains are likely due to spasms of esophagus related to reflux.  No nausea or vomiting.  No weight loss.  Has history of longstanding chronic constipation with BMs 1 in 3 to 4 days.  Still has problems with hemorrhoids.  Has used suppositories in the past without any significant relief.  Has been having significant reflux symptoms including heartburn, regurgitation especially at night and in the morning without any odynophagia or dysphagia. This is despite Protonix and Zantac. No melena or hematochezia  Also has been having substernal chest pains.  Has been told that he has noncardiac chest pains with negative cardiac and pulmonary evaluation.  Advised GI evaluation.  Is followed by pulmonary.  He had  positive sleep study and is currently on CPAP.  Also being evaluated for questionable ILD  History of coronary artery disease status post PCI/DES to LAD 04/08/2018, patient is currently on aspirin and Plavix.  Being followed by Dr. Bettina Gavia.  Has gained weight.  Wt Readings from Last 3 Encounters:  02/14/22 258 lb 12.8 oz (117.4 kg)  12/25/21 258 lb (117 kg)  05/23/20 247 lb (112 kg)     Past GI procedures:  EGD 05/23/2020 - Small hiatal hernia. Neg eso bx for EoE - Gastritis. Bx- neg for HP. - A few gastric polyps. Bx-fundic gland polyps  -Colonoscopy 11/23/2016-6 mm sigmoid polyp status post polypectomy, mild sigmoid diverticulosis. Bx- TA. Rpt 2023. -CT 10/08/2016- neg. Past Medical History:  Diagnosis Date   Abnormal finding on lung imaging 03/31/2018   October 2019 CT chest  showing normal pulmonary parenchyma in the upper lobes but in the bases there is atelectasis and some airway thickening with surrounding groundglass.  Compared to in April 2018 CT scan of his abdomen this does appear to be progressive.  10/07/2018-chest x-ray ([facility])- chronic appearing lingular opacity, no acute infiltrate edema mass or effusion otherwise, chr   Anxiety 03/24/2018   Benign essential hypertension 03/24/2018   CAD (coronary artery disease)    s/p DES to pLAD & mLAD 04/08/18   Chest pain in adult 03/24/2018   Chlorine gas exposure 10/08/2018   Depression 03/24/2018   Frequent headaches 03/24/2018   GAD (generalized anxiety disorder) 04/05/2018   GERD (gastroesophageal reflux  disease) 03/24/2018   Hypercholesterolemia 03/24/2018   Hypotestosteronemia 03/24/2018   OSA (obstructive sleep apnea) 10/08/2018   Split-night sleep study January 2020 AHI 24.3 with optimal control on CPAP 12 cm H2O.    Shortness of breath 10/08/2018   10/08/2018-CT chest without contrast- lingular scarring, no confluent opacities or effusions otherwise  12/10/2018-pulmonary function test- FVC 4.15 (79% predicted),  postbronchodilator ratio 84, postbronchodilator FEV1 3.72 (91% predicted), no bronchodilator response, mid flow reversibility, DLCO 94   Sleep apnea    Substernal chest pain 03/24/2018    Past Surgical History:  Procedure Laterality Date   CARDIAC CATHETERIZATION     COLONOSCOPY  11/23/2016   Colon polyps. Diverticulosis of colon. Internal hemorrhoids.    CORONARY STENT INTERVENTION N/A 04/08/2018   Procedure: CORONARY STENT INTERVENTION;  Surgeon: Jettie Booze, MD;  Location: [facility];  Service: Cardiovascular;  Laterality: N/A;   LEFT HEART CATH AND CORONARY ANGIOGRAPHY N/A 04/08/2018   Procedure: LEFT HEART CATH AND CORONARY ANGIOGRAPHY;  Surgeon: Jettie Booze, MD;  Location: [facility];  Service: Cardiovascular;  Laterality: N/A;   UPPER GASTROINTESTINAL ENDOSCOPY      Family History  Problem Relation Age of Onset   Hypertension Mother    ALS Father    Stroke Brother    Heart disease Neg Hx    Cancer Neg Hx    Diabetes Neg Hx    Colon cancer Neg Hx    Esophageal cancer Neg Hx    Stomach cancer Neg Hx    Rectal cancer Neg Hx     Social History   Tobacco Use   Smoking status: Never   Smokeless tobacco: Never  Vaping Use   Vaping Use: Never used  Substance Use Topics   Alcohol use: Not Currently   Drug use: Not Currently    Current Outpatient Medications  Medication Sig Dispense Refill   clopidogrel (PLAVIX) 75 MG tablet TAKE ONE TABLET BY MOUTH DAILY WITH BREAKFAST 30 tablet 11   DEXILANT 60 MG capsule Take 1 capsule by mouth daily.     diclofenac (VOLTAREN) 50 MG EC tablet Take 50 mg by mouth 3 (three) times daily.     docusate sodium (COLACE) 100 MG capsule Take 100 mg by mouth daily.     ezetimibe (ZETIA) 10 MG tablet Take 10 mg by mouth daily. Monday / Wednesday / Friday     levocetirizine (XYZAL) 5 MG tablet Take 5 mg by mouth every evening.     lisinopril (PRINIVIL,ZESTRIL) 10 MG tablet Take 10 mg by mouth every evening.   1    metoprolol succinate (TOPROL-XL) 50 MG 24 hr tablet Take 1 tablet (50 mg total) by mouth daily. Take with or immediately following a meal. 30 tablet 3   nitroGLYCERIN (NITROSTAT) 0.4 MG SL tablet Place 1 tablet (0.4 mg total) under the tongue every 5 (five) minutes as needed. 25 tablet 2   pregabalin (LYRICA) 150 MG capsule Take 150 mg by mouth daily.     rosuvastatin (CRESTOR) 40 MG tablet Take 40 mg by mouth daily.     traMADol (ULTRAM) 50 MG tablet Take 150 mg by mouth as needed for pain.     Current Facility-Administered Medications  Medication Dose Route Frequency Provider Last Rate Last Admin   0.9 %  sodium chloride infusion  500 mL Intravenous Once Jackquline Denmark, MD        Allergies  Allergen Reactions   Citalopram Other (See Comments)    "  ineffective"   Sertraline Other (See Comments)    "ineffective"   Venlafaxine Other (See Comments)    "limited efficacy"    Review of Systems:  neg     Physical Exam:    BP 130/86   Pulse 67   Ht 6' (1.829 m)   Wt 258 lb 12.8 oz (117.4 kg)   SpO2 96%   BMI 35.10 kg/m  Filed Weights   02/14/22 0834  Weight: 258 lb 12.8 oz (117.4 kg)   Constitutional:  Well-developed, in no acute distress. Psychiatric: Normal mood and affect. Behavior is normal. HEENT: Pupils normal.  Conjunctivae are normal. No scleral icterus. Cardiovascular: Normal rate, regular rhythm. No edema Pulmonary/chest: Effort normal and breath sounds normal. No wheezing, rales or rhonchi. Abdominal: Soft, nondistended. Nontender. Bowel sounds active throughout. There are no masses palpable. No hepatomegaly. Rectal: He would like to get it performed at the time of colonoscopy. Neurological: Alert and oriented to person place and time. Skin: Skin is warm and dry. No rashes noted. Examined in presence of Brandy Data Reviewed: I have personally reviewed following labs and imaging studies  CBC:    Latest Ref Rng & Units 04/09/2018    3:22 AM 03/31/2018   11:54  AM  CBC  WBC 4.0 - 10.5 K/uL 9.9  12.8   Hemoglobin 13.0 - 17.0 g/dL 14.8  17.9   Hematocrit 39.0 - 52.0 % 45.9  51.0   Platelets 150 - 400 K/uL 253  229     CMP:    Latest Ref Rng & Units 04/09/2018    3:22 AM 03/31/2018   11:54 AM  CMP  Glucose 70 - 99 mg/dL 135  93   BUN 6 - 20 mg/dL 14  12   Creatinine 0.61 - 1.24 mg/dL 1.08  1.20   Sodium 135 - 145 mmol/L 137  140   Potassium 3.5 - 5.1 mmol/L 3.7  4.6   Chloride 98 - 111 mmol/L 106  100   CO2 22 - 32 mmol/L 23  25   Calcium 8.9 - 10.3 mg/dL 8.4  9.6      Carmell Austria, MD 02/14/2022, 8:38 AM  Cc: Janalyn Rouse, FNP

## 2022-02-14 NOTE — Patient Instructions (Addendum)
_______________________________________________________  If you are age 54 or older, your body mass index should be between 23-30. Your Body mass index is 35.1 kg/m. If this is out of the aforementioned range listed, please consider follow up with your Primary Care Provider.  If you are age 55 or younger, your body mass index should be between 19-25. Your Body mass index is 35.1 kg/m. If this is out of the aformentioned range listed, please consider follow up with your Primary Care Provider.   ________________________________________________________  The [facility] providers would like to encourage you to use [facility] to communicate with providers for non-urgent requests or questions.  Due to long hold times on the telephone, sending your provider a message by [facility] may be a faster and more efficient way to get a response.  Please allow 48 business hours for a response.  Please remember that this is for non-urgent requests.  _______________________________________________________  We have sent the following medications to your pharmacy for you to pick up at your convenience: Prevacid Pepcid Flut[facility]asone cream[facility] Clenpiq  You have been scheduled for a colonoscopy. Please follow written instructions given to you at your visit today.  Please pick up your prep supplies at the pharmacy within the next 1-3 days. If you use inhalers (even only as needed), please bring them with you on the day of your procedure.  Two days before your procedure: Mix 3 packs (or capfuls) of Miralax in 48 ounces of clear liquid and drink at 6pm.  You will be contacted by our office prior to your procedure for directions on holding your Plavix.  If you do not hear from our office 2 week prior to your scheduled procedure, please call [phone] to discuss.   Thank you,  Dr. Jackquline Denmark

## 2022-02-14 NOTE — Telephone Encounter (Signed)
[facility] Pre-operative Risk Assessment     Request for surgical clearance:     Endoscopy Procedure  What type of surgery is being performed?     Colonoscopy  When is this surgery scheduled?     04-03-2022  What type of clearance is required ?   Pharmacy  Are there any medications that need to be held prior to surgery and how long? Plavix 5 day hold prior to procedure  Practice name and name of physician performing surgery?      [facility]  What is your office phone and fax number?      Phone- [phone]  [phone]  Anesthesia type (None, local, MAC, general) ?       MAC

## 2022-02-20 NOTE — Telephone Encounter (Signed)
Primary Cardiologist:Brian Bettina Gavia, MD   Preoperative team, please contact this patient and set up a phone call appointment for further preoperative risk assessment. Please obtain consent and complete medication review. Thank you for your help.   I confirm that guidance regarding antiplatelet and oral anticoagulation therapy has been completed and, if necessary, noted below. He may hold Plavix for 5 days prior to procedure pending no current concerns for ACS.    Emmaline Life, NP-C  02/20/2022, 5:12 PM [address] Office [phone] Fax [phone]

## 2022-02-21 NOTE — Telephone Encounter (Signed)
Tried to reach pt to set up tele pre op appt , but vm is full

## 2022-02-22 ENCOUNTER — Telehealth: Payer: Self-pay | Admitting: *Deleted

## 2022-02-22 NOTE — Telephone Encounter (Signed)
Pt agreeable to plan of care for tele pre op appt 03/23/22 @ 3 pm. Med rec and consent are done.

## 2022-02-22 NOTE — Telephone Encounter (Signed)
Pt agreeable to plan of care for tele pre op appt 03/23/22 @ 3 pm. Med rec and consent are done.    Patient Consent for Virtual Visit        Aaron Reese has provided verbal consent on 02/22/2022 for a virtual visit (video or telephone).   CONSENT FOR VIRTUAL VISIT FOR:  Aaron Reese  By participating in this virtual visit I agree to the following:  I hereby voluntarily request, consent and authorize [facility] and its employed or contracted physicians, physician assistants, nurse practitioners or other licensed health care professionals (the Practitioner), to provide me with telemedicine health care services (the "Services") as deemed necessary by the treating Practitioner. I acknowledge and consent to receive the Services by the Practitioner via telemedicine. I understand that the telemedicine visit will involve communicating with the Practitioner through live audiovisual communication technology and the disclosure of certain medical information by electronic transmission. I acknowledge that I have been given the opportunity to request an in-person assessment or other available alternative prior to the telemedicine visit and am voluntarily participating in the telemedicine visit.  I understand that I have the right to withhold or withdraw my consent to the use of telemedicine in the course of my care at any time, without affecting my right to future care or treatment, and that the Practitioner or I may terminate the telemedicine visit at any time. I understand that I have the right to inspect all information obtained and/or recorded in the course of the telemedicine visit and may receive copies of available information for a reasonable fee.  I understand that some of the potential risks of receiving the Services via telemedicine include:  Delay or interruption in medical evaluation due to technological equipment failure or disruption; Information transmitted may not be sufficient (e.g.  poor resolution of images) to allow for appropriate medical decision making by the Practitioner; and/or  In rare instances, security protocols could fail, causing a breach of personal health information.  Furthermore, I acknowledge that it is my responsibility to provide information about my medical history, conditions and care that is complete and accurate to the best of my ability. I acknowledge that Practitioner's advice, recommendations, and/or decision may be based on factors not within their control, such as incomplete or inaccurate data provided by me or distortions of diagnostic images or specimens that may result from electronic transmissions. I understand that the practice of medicine is not an exact science and that Practitioner makes no warranties or guarantees regarding treatment outcomes. I acknowledge that a copy of this consent can be made available to me via my patient portal ([facility]), or I can request a printed copy by calling the office of [facility].    I understand that my insurance will be billed for this visit.   I have read or had this consent read to me. I understand the contents of this consent, which adequately explains the benefits and risks of the Services being provided via telemedicine.  I have been provided ample opportunity to ask questions regarding this consent and the Services and have had my questions answered to my satisfaction. I give my informed consent for the services to be provided through the use of telemedicine in my medical care

## 2022-03-23 ENCOUNTER — Ambulatory Visit: Payer: 59 | Attending: Cardiovascular Disease | Admitting: General Practice

## 2022-03-23 DIAGNOSIS — Z01810 Encounter for preprocedural cardiovascular examination: Secondary | ICD-10-CM | POA: Diagnosis not present

## 2022-03-23 NOTE — Progress Notes (Signed)
Virtual Visit via Telephone Note   Because of Aaron Reese's co-morbid illnesses, he is at least at moderate risk for complications without adequate follow up.  This format is felt to be most appropriate for this patient at this time.  The patient did not have access to video technology/had technical difficulties with video requiring transitioning to audio format only (telephone).  All issues noted in this document were discussed and addressed.  No physical exam could be performed with this format.  Please refer to the patient's chart for his consent to telehealth for [facility].  Evaluation Performed:  Preoperative cardiovascular risk assessment _____________   Date:  03/23/2022   Patient ID:  Aaron Reese, DOB 06-Jun-1966, MRN [id number] Patient Location:  Home Provider location:   Office  Primary Care Provider:  Janalyn Rouse, FNP Primary Cardiologist:  Shirlee More, MD  Chief Complaint / Patient Profile   56 y.o. y/o male with a h/o CAD, HTN, HLD who is pending colonoscopy and presents today for telephonic preoperative cardiovascular risk assessment.  Past Medical History    Past Medical History:  Diagnosis Date   Abnormal finding on lung imaging 03/31/2018   October 2019 CT chest  showing normal pulmonary parenchyma in the upper lobes but in the bases there is atelectasis and some airway thickening with surrounding groundglass.  Compared to in April 2018 CT scan of his abdomen this does appear to be progressive.  10/07/2018-chest x-ray ([facility])- chronic appearing lingular opacity, no acute infiltrate edema mass or effusion otherwise, chr   Anxiety 03/24/2018   Benign essential hypertension 03/24/2018   CAD (coronary artery disease)    s/p DES to pLAD & mLAD 04/08/18   Chest pain in adult 03/24/2018   Chlorine gas exposure 10/08/2018   Depression 03/24/2018   Frequent headaches 03/24/2018   GAD (generalized anxiety disorder) 04/05/2018   GERD  (gastroesophageal reflux disease) 03/24/2018   Hypercholesterolemia 03/24/2018   Hypotestosteronemia 03/24/2018   OSA (obstructive sleep apnea) 10/08/2018   Split-night sleep study January 2020 AHI 24.3 with optimal control on CPAP 12 cm H2O.    Shortness of breath 10/08/2018   10/08/2018-CT chest without contrast- lingular scarring, no confluent opacities or effusions otherwise  12/10/2018-pulmonary function test- FVC 4.15 (79% predicted), postbronchodilator ratio 84, postbronchodilator FEV1 3.72 (91% predicted), no bronchodilator response, mid flow reversibility, DLCO 94   Sleep apnea    Substernal chest pain 03/24/2018   Past Surgical History:  Procedure Laterality Date   CARDIAC CATHETERIZATION     COLONOSCOPY  11/23/2016   Colon polyps. Diverticulosis of colon. Internal hemorrhoids.    CORONARY STENT INTERVENTION N/A 04/08/2018   Procedure: CORONARY STENT INTERVENTION;  Surgeon: Jettie Booze, MD;  Location: [facility];  Service: Cardiovascular;  Laterality: N/A;   LEFT HEART CATH AND CORONARY ANGIOGRAPHY N/A 04/08/2018   Procedure: LEFT HEART CATH AND CORONARY ANGIOGRAPHY;  Surgeon: Jettie Booze, MD;  Location: [facility];  Service: Cardiovascular;  Laterality: N/A;   UPPER GASTROINTESTINAL ENDOSCOPY      Allergies  Allergies  Allergen Reactions   Citalopram Other (See Comments)    "ineffective"   Sertraline Other (See Comments)    "ineffective"   Venlafaxine Other (See Comments)    "limited efficacy"    History of Present Illness    [name] is a 56 y.o. male who presents via audio/video conferencing for a telehealth visit today.  Pt was last seen in cardiology clinic on 12/25/2021 by  Dr. Bettina Gavia.  At that time Aaron Reese was doing well .  The patient is now pending procedure as outlined above. Since his last visit, he remains stable from cardiac standpoint.   Today he denies chest pain, shortness of breath, lower extremity edema, fatigue,  palpitations, melena, hematuria, hemoptysis, diaphoresis, weakness, presyncope, syncope, orthopnea, and PND.   Home Medications    Prior to Admission medications   Medication Sig Start Date End Date Taking? Authorizing Provider  clopidogrel (PLAVIX) 75 MG tablet TAKE ONE TABLET BY MOUTH DAILY WITH BREAKFAST 10/27/18   Reino Bellis B, NP  DEXILANT 60 MG capsule Take 1 capsule by mouth daily. 12/05/21   [provider]  diclofenac (VOLTAREN) 50 MG EC tablet Take 50 mg by mouth 3 (three) times daily. 10/24/20   [provider]  docusate sodium (COLACE) 100 MG capsule Take 100 mg by mouth daily. 10/25/20   [provider]  ezetimibe (ZETIA) 10 MG tablet Take 10 mg by mouth daily. Monday / Wednesday / Friday 07/29/20   [provider]  famotidine (PEPCID) 40 MG tablet Take 1 tablet (40 mg total) by mouth at bedtime. 02/14/22   Jackquline Denmark, MD  fluticasone (CUTIVATE) 0.05 % cream Apply topically 2 (two) times daily. Apply to rectum for 10 days and then use as needed 02/14/22   Jackquline Denmark, MD  lansoprazole (PREVACID) 30 MG capsule Take 1 capsule (30 mg total) by mouth 2 (two) times daily before a meal. 02/14/22   Jackquline Denmark, MD  levocetirizine (XYZAL) 5 MG tablet Take 5 mg by mouth every evening. 10/29/19   [provider]  lisinopril (PRINIVIL,ZESTRIL) 10 MG tablet Take 10 mg by mouth every evening.  02/05/18   [provider]  metoprolol succinate (TOPROL-XL) 50 MG 24 hr tablet Take 1 tablet (50 mg total) by mouth daily. Take with or immediately following a meal. 05/26/19   Munley, Hilton Cork, MD  nitroGLYCERIN (NITROSTAT) 0.4 MG SL tablet Place 1 tablet (0.4 mg total) under the tongue every 5 (five) minutes as needed. 04/09/18   Cheryln Manly, NP  pregabalin (LYRICA) 150 MG capsule Take 150 mg by mouth daily. 10/10/21   [provider]  rosuvastatin (CRESTOR) 40 MG tablet Take 40 mg by mouth daily. 03/14/20   [provider]   Sod Picosulfate-Mag Ox-Cit Acd (CLENPIQ) 10-3.5-12 MG-GM -GM/175ML SOLN Take 1 kit by mouth as directed. 02/14/22   Jackquline Denmark, MD  traMADol (ULTRAM) 50 MG tablet Take 150 mg by mouth as needed for pain. 11/27/21   [provider]    Physical Exam    Vital Signs:  Karan Ramnauth does not have vital signs available for review today.  Given telephonic nature of communication, physical exam is limited. AAOx3. NAD. Normal affect.  Speech and respirations are unlabored.  Accessory Clinical Findings    None  Assessment & Plan    1.  Preoperative Cardiovascular Risk Assessment: Colonoscopy, [facility]    Primary Cardiologist: Shirlee More, MD  Chart reviewed as part of pre-operative protocol coverage. Given past medical history and time since last visit, based on ACC/AHA guidelines, Morgan Rennert would be at acceptable risk for the planned procedure without further cardiovascular testing.   The patient was advised that if he develops new symptoms prior to surgery to contact our office to arrange for a follow-up visit, and he verbalized understanding.  He may hold Plavix for 5 days prior to procedure please resume as soon as hemostasis is achieved.  A copy of this note will be routed to requesting surgeon.  Time:   Today, I have spent 5 minutes with the patient with telehealth technology discussing medical history, symptoms, and management plan.  Prior to his phone evaluation I spent greater than 10 minutes reviewing his past medical history and cardiac medications.   Deberah Pelton, NP  03/23/2022, 8:33 AM

## 2022-03-23 NOTE — Progress Notes (Signed)
SEE CLEARANCE NOTES FAXED TODAY

## 2022-04-03 ENCOUNTER — Encounter: Payer: Self-pay | Admitting: Gastroenterology

## 2022-04-03 ENCOUNTER — Ambulatory Visit (AMBULATORY_SURGERY_CENTER): Payer: 59 | Admitting: Gastroenterology

## 2022-04-03 VITALS — BP 135/88 | HR 77 | Temp 97.5°F | Resp 16 | Ht 72.0 in | Wt 258.0 lb

## 2022-04-03 DIAGNOSIS — K649 Unspecified hemorrhoids: Secondary | ICD-10-CM

## 2022-04-03 DIAGNOSIS — D120 Benign neoplasm of cecum: Secondary | ICD-10-CM | POA: Diagnosis not present

## 2022-04-03 DIAGNOSIS — K602 Anal fissure, unspecified: Secondary | ICD-10-CM

## 2022-04-03 DIAGNOSIS — Z86010 Personal history of colonic polyps: Secondary | ICD-10-CM

## 2022-04-03 DIAGNOSIS — D125 Benign neoplasm of sigmoid colon: Secondary | ICD-10-CM

## 2022-04-03 DIAGNOSIS — K625 Hemorrhage of anus and rectum: Secondary | ICD-10-CM

## 2022-04-03 DIAGNOSIS — K5730 Diverticulosis of large intestine without perforation or abscess without bleeding: Secondary | ICD-10-CM

## 2022-04-03 MED ORDER — HYDROCORTISONE ACETATE 25 MG RE SUPP: 25.0000 mg | Freq: Two times a day (BID) | RECTAL | 4 refills | Status: DC

## 2022-04-03 MED ORDER — SODIUM CHLORIDE 0.9 % IV SOLN: 500.0000 mL | Freq: Once | INTRAVENOUS | Status: DC

## 2022-04-03 NOTE — Progress Notes (Signed)
Vitals-DT  Pt's states no medical or surgical changes since previsit or office visit.  

## 2022-04-03 NOTE — Progress Notes (Signed)
PT taken to PACU. Monitors in place. VSS. Report given to RN. 

## 2022-04-03 NOTE — Progress Notes (Signed)
Chief Complaint: Gastroesophageal reflux  Referring Provider:  Janalyn Rouse, FNP      ASSESSMENT AND PLAN;   #1. GERD -failed Protonix/Zantac. #2. Atypical chest pains. Pt with H/O CAD s/p PCI/DES to LAD 03/2018 on ASA/plavix (followed by Dr. Bettina Gavia), HTN, HLD. Neg CT abdo/pevis 09/2016. #3. Recently diagnosed sleep apnea on CPAP. #4. H/O polyps #4. Internal hemorrhoids vs small anal fissure  Plan: - Increase Pravacid '30mg'$  po BID #180 - Continue Pepcid '40mg'$  po qhs.90 - if still with UGI problems problems, 24hr/mano off PPI, then eval for TIFS - Colon with 2 day prep off plavix x 5 days after cardiology clearance from Dr Bettina Gavia. - fluticasone cream 0.05% generic 30g 1 bid PR x 10 days, 2 refills ([facility]).  If continued problems, would consider hemorrhoidal banding at later date - Nonpharmacologic means of reflux control. - Encouraged him to lose weight.  - Stop Voltaren   HPI:    Aaron Reese is a 56 y.o. male  With anxiety, CAD, HTN, OSA on CPAP, HLD  Still with reflux Better than before with Prevacid daily More in the evening.  No odynophagia or dysphagia.  Still has occasional substernal chest pains.  Had negative cardiac evaluation.  Dr. Bettina Gavia thinks the chest pains are likely due to spasms of esophagus related to reflux.  No nausea or vomiting.  No weight loss.  Has history of longstanding chronic constipation with BMs 1 in 3 to 4 days.  Still has problems with hemorrhoids.  Has used suppositories in the past without any significant relief.  Has been having significant reflux symptoms including heartburn, regurgitation especially at night and in the morning without any odynophagia or dysphagia. This is despite Protonix and Zantac. No melena or hematochezia  Also has been having substernal chest pains.  Has been told that he has noncardiac chest pains with negative cardiac and pulmonary evaluation.  Advised GI evaluation.  Is followed by pulmonary.  He had  positive sleep study and is currently on CPAP.  Also being evaluated for questionable ILD  History of coronary artery disease status post PCI/DES to LAD 04/08/2018, patient is currently on aspirin and Plavix.  Being followed by Dr. Bettina Gavia.  Has gained weight.  Wt Readings from Last 3 Encounters:  04/03/22 258 lb (117 kg)  02/14/22 258 lb 12.8 oz (117.4 kg)  12/25/21 258 lb (117 kg)     Past GI procedures:  EGD 05/23/2020 - Small hiatal hernia. Neg eso bx for EoE - Gastritis. Bx- neg for HP. - A few gastric polyps. Bx-fundic gland polyps  -Colonoscopy 11/23/2016-6 mm sigmoid polyp status post polypectomy, mild sigmoid diverticulosis. Bx- TA. Rpt 2023. -CT 10/08/2016- neg. Past Medical History:  Diagnosis Date   Abnormal finding on lung imaging 03/31/2018   October 2019 CT chest  showing normal pulmonary parenchyma in the upper lobes but in the bases there is atelectasis and some airway thickening with surrounding groundglass.  Compared to in April 2018 CT scan of his abdomen this does appear to be progressive.  10/07/2018-chest x-ray ([facility])- chronic appearing lingular opacity, no acute infiltrate edema mass or effusion otherwise, chr   Anxiety 03/24/2018   Benign essential hypertension 03/24/2018   CAD (coronary artery disease)    s/p DES to pLAD & mLAD 04/08/18   Chest pain in adult 03/24/2018   Chlorine gas exposure 10/08/2018   Depression 03/24/2018   Frequent headaches 03/24/2018   GAD (generalized anxiety disorder) 04/05/2018   GERD (gastroesophageal reflux  disease) 03/24/2018   Hypercholesterolemia 03/24/2018   Hypotestosteronemia 03/24/2018   OSA (obstructive sleep apnea) 10/08/2018   Split-night sleep study January 2020 AHI 24.3 with optimal control on CPAP 12 cm H2O.    Shortness of breath 10/08/2018   10/08/2018-CT chest without contrast- lingular scarring, no confluent opacities or effusions otherwise  12/10/2018-pulmonary function test- FVC 4.15 (79% predicted),  postbronchodilator ratio 84, postbronchodilator FEV1 3.72 (91% predicted), no bronchodilator response, mid flow reversibility, DLCO 94   Sleep apnea    Substernal chest pain 03/24/2018    Past Surgical History:  Procedure Laterality Date   CARDIAC CATHETERIZATION     COLONOSCOPY  11/23/2016   Colon polyps. Diverticulosis of colon. Internal hemorrhoids.    CORONARY STENT INTERVENTION N/A 04/08/2018   Procedure: CORONARY STENT INTERVENTION;  Surgeon: Jettie Booze, MD;  Location: [facility];  Service: Cardiovascular;  Laterality: N/A;   LEFT HEART CATH AND CORONARY ANGIOGRAPHY N/A 04/08/2018   Procedure: LEFT HEART CATH AND CORONARY ANGIOGRAPHY;  Surgeon: Jettie Booze, MD;  Location: [facility];  Service: Cardiovascular;  Laterality: N/A;   UPPER GASTROINTESTINAL ENDOSCOPY      Family History  Problem Relation Age of Onset   Hypertension Mother    ALS Father    Stroke Brother    Heart disease Neg Hx    Cancer Neg Hx    Diabetes Neg Hx    Colon cancer Neg Hx    Esophageal cancer Neg Hx    Stomach cancer Neg Hx    Rectal cancer Neg Hx     Social History   Tobacco Use   Smoking status: Never   Smokeless tobacco: Never  Vaping Use   Vaping Use: Never used  Substance Use Topics   Alcohol use: Not Currently   Drug use: Not Currently    Current Outpatient Medications  Medication Sig Dispense Refill   DEXILANT 60 MG capsule Take 1 capsule by mouth daily.     docusate sodium (COLACE) 100 MG capsule Take 100 mg by mouth daily.     ezetimibe (ZETIA) 10 MG tablet Take 10 mg by mouth daily. Monday / Wednesday / Friday     famotidine (PEPCID) 40 MG tablet Take 1 tablet (40 mg total) by mouth at bedtime. 90 tablet 4   fluticasone (CUTIVATE) 0.05 % cream Apply topically 2 (two) times daily. Apply to rectum for 10 days and then use as needed 30 g 2   lansoprazole (PREVACID) 30 MG capsule Take 1 capsule (30 mg total) by mouth 2 (two) times daily before a  meal. 180 capsule 4   levocetirizine (XYZAL) 5 MG tablet Take 5 mg by mouth every evening.     lisinopril (PRINIVIL,ZESTRIL) 10 MG tablet Take 10 mg by mouth every evening.   1   metoprolol succinate (TOPROL-XL) 50 MG 24 hr tablet Take 1 tablet (50 mg total) by mouth daily. Take with or immediately following a meal. 30 tablet 3   pregabalin (LYRICA) 150 MG capsule Take 150 mg by mouth daily.     rosuvastatin (CRESTOR) 40 MG tablet Take 40 mg by mouth daily.     clopidogrel (PLAVIX) 75 MG tablet TAKE ONE TABLET BY MOUTH DAILY WITH BREAKFAST 30 tablet 11   diclofenac (VOLTAREN) 50 MG EC tablet Take 50 mg by mouth 3 (three) times daily. (Patient not taking: Reported on 04/03/2022)     nitroGLYCERIN (NITROSTAT) 0.4 MG SL tablet Place 1 tablet (0.4 mg total) under the tongue  every 5 (five) minutes as needed. (Patient not taking: Reported on 04/03/2022) 25 tablet 2   traMADol (ULTRAM) 50 MG tablet Take 150 mg by mouth as needed for pain. (Patient not taking: Reported on 04/03/2022)     Current Facility-Administered Medications  Medication Dose Route Frequency Provider Last Rate Last Admin   0.9 %  sodium chloride infusion  500 mL Intravenous Once Jackquline Denmark, MD       0.9 %  sodium chloride infusion  500 mL Intravenous Once Jackquline Denmark, MD       0.9 %  sodium chloride infusion  500 mL Intravenous Once Jackquline Denmark, MD        Allergies  Allergen Reactions   Citalopram Other (See Comments)    "ineffective"   Sertraline Other (See Comments)    "ineffective"   Venlafaxine Other (See Comments)    "limited efficacy"    Review of Systems:  neg     Physical Exam:    BP 126/71 (BP Location: Right Arm, Patient Position: Sitting, Cuff Size: Normal)   Pulse 77   Temp (!) 97.5 F (36.4 C) (Temporal)   Ht 6' (1.829 m)   Wt 258 lb (117 kg)   SpO2 95%   BMI 34.99 kg/m  Filed Weights   04/03/22 0815  Weight: 258 lb (117 kg)   Constitutional:  Well-developed, in no acute  distress. Psychiatric: Normal mood and affect. Behavior is normal. HEENT: Pupils normal.  Conjunctivae are normal. No scleral icterus. Cardiovascular: Normal rate, regular rhythm. No edema Pulmonary/chest: Effort normal and breath sounds normal. No wheezing, rales or rhonchi. Abdominal: Soft, nondistended. Nontender. Bowel sounds active throughout. There are no masses palpable. No hepatomegaly. Rectal: He would like to get it performed at the time of colonoscopy. Neurological: Alert and oriented to person place and time. Skin: Skin is warm and dry. No rashes noted. Examined in presence of Brandy Data Reviewed: I have personally reviewed following labs and imaging studies  CBC:    Latest Ref Rng & Units 04/09/2018    3:22 AM 03/31/2018   11:54 AM  CBC  WBC 4.0 - 10.5 K/uL 9.9  12.8   Hemoglobin 13.0 - 17.0 g/dL 14.8  17.9   Hematocrit 39.0 - 52.0 % 45.9  51.0   Platelets 150 - 400 K/uL 253  229     CMP:    Latest Ref Rng & Units 04/09/2018    3:22 AM 03/31/2018   11:54 AM  CMP  Glucose 70 - 99 mg/dL 135  93   BUN 6 - 20 mg/dL 14  12   Creatinine 0.61 - 1.24 mg/dL 1.08  1.20   Sodium 135 - 145 mmol/L 137  140   Potassium 3.5 - 5.1 mmol/L 3.7  4.6   Chloride 98 - 111 mmol/L 106  100   CO2 22 - 32 mmol/L 23  25   Calcium 8.9 - 10.3 mg/dL 8.4  9.6      Carmell Austria, MD 04/03/2022, 9:04 AM  Cc: Janalyn Rouse, FNP

## 2022-04-03 NOTE — Progress Notes (Signed)
Called to room to assist during endoscopic procedure.  Patient ID and intended procedure confirmed with present staff. Received instructions for my participation in the procedure from the performing physician.  

## 2022-04-03 NOTE — Op Note (Signed)
[facility] Patient Name: Aaron Reese Procedure Date: 04/03/2022 8:57 AM MRN: [id number] Endoscopist: Jackquline Denmark , MD Age: 56 Referring MD:  Date of Birth: [birth year] Gender: Male Account #: [id number] Procedure:                Colonoscopy Indications:              Rectal bleeding Medicines:                Monitored Anesthesia Care Procedure:                Pre-Anesthesia Assessment:                           - Prior to the procedure, a History and Physical                            was performed, and patient medications and                            allergies were reviewed. The patient's tolerance of                            previous anesthesia was also reviewed. The risks                            and benefits of the procedure and the sedation                            options and risks were discussed with the patient.                            All questions were answered, and informed consent                            was obtained. Prior Anticoagulants: The patient has                            taken Plavix (clopidogrel), last dose was 5 days                            prior to procedure. ASA Grade Assessment: III - A                            patient with severe systemic disease. After                            reviewing the risks and benefits, the patient was                            deemed in satisfactory condition to undergo the                            procedure.  After obtaining informed consent, the colonoscope                            was passed under direct vision. Throughout the                            procedure, the patient's blood pressure, pulse, and                            oxygen saturations were monitored continuously. The                            CF [id number] #[id number] was introduced through the anus                            and advanced to the 4 cm into the ileum. The                             colonoscopy was performed without difficulty. The                            patient tolerated the procedure well. The quality                            of the bowel preparation was good. The terminal                            ileum, ileocecal valve, appendiceal orifice, and                            rectum were photographed. Scope In: 9:16:24 AM Scope Out: 9:33:40 AM Scope Withdrawal Time: 0 hours 13 minutes 6 seconds  Total Procedure Duration: 0 hours 17 minutes 16 seconds  Findings:                 Rectal examination revealed anorectal scarring with                            mild stenosis and 1 cm posterior anal fissure.                           Two sessile polyps were found in the distal sigmoid                            colon and cecum. The polyps were 4 to 6 mm in size.                            These polyps were removed with a cold snare.                            Resection and retrieval were complete.                           A few  small-mouthed diverticula were found in the                            sigmoid colon.                           The terminal ileum appeared normal.                           Non-bleeding internal hemorrhoids were found during                            retroflexion. The hemorrhoids were small.                           The exam was otherwise without abnormality on                            direct and retroflexion views. Complications:            No immediate complications. Estimated Blood Loss:     Estimated blood loss: none. Impression:               - Anal fissure with ano-rectal scarring and mild                            stenosis found on perianal exam. Etiology of rectal                            bleeding                           - Two 4 to 6 mm polyps in the distal sigmoid colon                            and in the cecum, removed with a cold snare.                            Resected and retrieved.                           - Mild  sigmoid diverticulosis                           - The examined portion of the ileum was normal.                           - Non-bleeding internal hemorrhoids.                           - The examination was otherwise normal on direct                            and retroflexion views. Recommendation:           - Patient has a contact number available for  emergencies. The signs and symptoms of potential                            delayed complications were discussed with the                            patient. Return to normal activities tomorrow.                            Written discharge instructions were provided to the                            patient.                           - High fiber diet.                           - Resume Plavix (clopidogrel) at prior dose in 3                            days.                           - Use hydrocortisone suppository 25 mg 2 per rectum                            once a day for 10 days 4RF.                           - Use Benefiber one teaspoon PO daily.                           - If continued bleeding, I would have low threshold                            in referring him for surgical opinion for                            EUA/fissure surgery.                           - The findings and recommendations were discussed                            with the patient's family. Jackquline Denmark, MD 04/03/2022 9:43:27 AM This report has been signed electronically.

## 2022-04-03 NOTE — Patient Instructions (Addendum)
-Handout on polyps, diverticulosis and hemorrhoids provided. - Patient has a contact number available for emergencies. The signs and symptoms of potential delayed complications were discussed with the patient. Return to normal activities tomorrow. Written discharge instructions were provided to the patient. - High fiber diet. - Resume Plavix (clopidogrel) at prior dose in 3 days. - Use hydrocortisone suppository 25 mg 1 per rectum twice a day, one in the morning and one at night, for 10 days. - Use Benefiber one teaspoon PO daily. - If continued bleeding, I would have low threshold in referring him for surgical opinion for EUA/fissure surgery. - The findings and recommendations were discussed with the patient's family.  YOU HAD AN ENDOSCOPIC PROCEDURE TODAY AT [facility]:   Refer to the procedure report that was given to you for any specific questions about what was found during the examination.  If the procedure report does not answer your questions, please call your gastroenterologist to clarify.  If you requested that your care partner not be given the details of your procedure findings, then the procedure report has been included in a sealed envelope for you to review at your convenience later.  YOU SHOULD EXPECT: Some feelings of bloating in the abdomen. Passage of more gas than usual.  Walking can help get rid of the air that was put into your GI tract during the procedure and reduce the bloating. If you had a lower endoscopy (such as a colonoscopy or flexible sigmoidoscopy) you may notice spotting of blood in your stool or on the toilet paper. If you underwent a bowel prep for your procedure, you may not have a normal bowel movement for a few days.  Please Note:  You might notice some irritation and congestion in your nose or some drainage.  This is from the oxygen used during your procedure.  There is no need for concern and it should clear up in a day or so.  SYMPTOMS TO  REPORT IMMEDIATELY:  Following lower endoscopy (colonoscopy or flexible sigmoidoscopy):  Excessive amounts of blood in the stool  Significant tenderness or worsening of abdominal pains  Swelling of the abdomen that is new, acute  Fever of 100F or higher  For urgent or emergent issues, a gastroenterologist can be reached at any hour by calling [phone]. Do not use MyChart messaging for urgent concerns.    DIET:  We do recommend a small meal at first, but then you may proceed to your regular diet.  Drink plenty of fluids but you should avoid alcoholic beverages for 24 hours.  ACTIVITY:  You should plan to take it easy for the rest of today and you should NOT DRIVE or use heavy machinery until tomorrow (because of the sedation medicines used during the test).    FOLLOW UP: Our staff will call the number listed on your records the next business day following your procedure.  We will call around 7:15- 8:00 am to check on you and address any questions or concerns that you may have regarding the information given to you following your procedure. If we do not reach you, we will leave a message.     If any biopsies were taken you will be contacted by phone or by letter within the next 1-3 weeks.  Please call us at [phone] if you have not heard about the biopsies in 3 weeks.    SIGNATURES/CONFIDENTIALITY: You and/or your care partner have signed paperwork which will be entered into your electronic  medical record.  These signatures attest to the fact that that the information above on your After Visit Summary has been reviewed and is understood.  Full responsibility of the confidentiality of this discharge information lies with you and/or your care-partner.

## 2022-04-04 ENCOUNTER — Telehealth: Payer: Self-pay | Admitting: *Deleted

## 2022-04-04 NOTE — Telephone Encounter (Signed)
  Follow up Call-     04/03/2022    8:21 AM 04/03/2022    8:15 AM 05/23/2020    9:12 AM  Call back number  Post procedure Call Back phone  # [phone]  [phone]  Permission to leave phone message  Yes Yes     Patient questions:  Do you have a fever, pain , or abdominal swelling? No. Pain Score  0 *  Have you tolerated food without any problems? Yes.    Have you been able to return to your normal activities? Yes.    Do you have any questions about your discharge instructions: Diet   No. Medications  No. Follow up visit  No.  Do you have questions or concerns about your Care? No.  Actions: * If pain score is 4 or above: No action needed, pain <4.

## 2022-04-07 ENCOUNTER — Encounter: Payer: Self-pay | Admitting: Gastroenterology

## 2022-05-02 DIAGNOSIS — K602 Anal fissure, unspecified: Secondary | ICD-10-CM

## 2022-05-02 HISTORY — DX: Anal fissure, unspecified: K60.2

## 2022-07-24 ENCOUNTER — Telehealth: Payer: Self-pay | Admitting: Pharmacy Technician

## 2022-07-24 NOTE — Telephone Encounter (Signed)
Patient Advocate Encounter  Received notification from [employer] that prior authorization for LANSOPRAZOLE '30MG'$  is required.   PA submitted on 2.6.24 Key [id number] Status is pending

## 2022-10-09 NOTE — Telephone Encounter (Signed)
PA has been APPROVED from 07/24/2022-07/25/2023

## 2023-07-12 ENCOUNTER — Encounter: Payer: Self-pay | Admitting: Cardiology

## 2023-07-14 NOTE — Progress Notes (Unsigned)
Cardiology Office Note:    Date:  07/15/2023   ID:  Aaron Reese, DOB Sep 08, 1965, MRN [id number]  PCP:  Olevia Bowens, FNP  Cardiologist:  Norman Herrlich, MD    Referring MD: Olevia Bowens, FNP    ASSESSMENT:    1. Coronary artery disease of native artery of native heart with stable angina pectoris (HCC)   2. Hypercholesterolemia   3. Benign essential hypertension    PLAN:    In order of problems listed above:  Aaron Reese continues to do well I think he needed a little bit of reassurance today having no anginal discomfort after his previous PCI and on good therapy will continue long-term lowering therapy with high-dose high intensity rosuvastatin and Zetia along with his beta-blocker and lisinopril for hypertension [facility] however there is no doubt in my mind that his LDL and non-HDL cholesterol remained the same range Well-controlled hypertension on ACE inhibitor I deal with coincident CAD   Next appointment: 1 year follow-up   Medication Adjustments/Labs and Tests Ordered: Current medicines are reviewed at length with the patient today.  Concerns regarding medicines are outlined above.  Orders Placed This Encounter  Procedures   EKG 12-Lead   No orders of the defined types were placed in this encounter.    History of Present Illness:    Aaron Reese is a 58 y.o. male with a hx of CAD with PCI and drug-eluting stents to left anterior descending coronary artery October 2019 hypertension and hyperlipidemia last seen 12/25/2021.He underwent a myocardial perfusion test performed 11/24/2019 with Lexiscan showing normal perfusion normal function EF 59%  One of his severe problems his last lipid profile from September 2023 was quite favorable cholesterol 121 LDL of 50 non-HDL cholesterol 70 HDL 36.  He follows up with city [facility] tells me his lipids remain excellent tolerates dual antiplatelet therapy without muscle pain or  weakness his blood pressure is well-controlled he continues clopidogrel long-term antiplatelet therapy He is always had a bit of vague discomfort from time to time but not anginal in nature he does physical activity without chest pain or restriction and is not having shortness of breath edema palpitation or syncope He has not needed nitroglycerin Compliance with diet, lifestyle and medications: Yes Past Medical History:  Diagnosis Date   Abnormal finding on lung imaging 03/31/2018   October 2019 CT chest  showing normal pulmonary parenchyma in the upper lobes but in the bases there is atelectasis and some airway thickening with surrounding groundglass.  Compared to in April 2018 CT scan of his abdomen this does appear to be progressive.  10/07/2018-chest x-ray ([place] [facility])- chronic appearing lingular opacity, no acute infiltrate edema mass or effusion otherwise, chr   Anxiety 03/24/2018   Benign essential hypertension 03/24/2018   CAD (coronary artery disease)    s/p DES to pLAD & mLAD 04/08/18   Chest pain in adult 03/24/2018   Chlorine gas exposure 10/08/2018   Depression 03/24/2018   Frequent headaches 03/24/2018   GAD (generalized anxiety disorder) 04/05/2018   GERD (gastroesophageal reflux disease) 03/24/2018   Hypercholesterolemia 03/24/2018   Hypotestosteronemia 03/24/2018   OSA (obstructive sleep apnea) 10/08/2018   Split-night sleep study January 2020 AHI 24.3 with optimal control on CPAP 12 cm H2O.    Shortness of breath 10/08/2018   10/08/2018-CT chest without contrast- lingular scarring, no confluent opacities or effusions otherwise  12/10/2018-pulmonary function test- FVC 4.15 (79% predicted), postbronchodilator ratio 84, postbronchodilator FEV1  3.72 (91% predicted), no bronchodilator response, mid flow reversibility, DLCO 94   Sleep apnea    Substernal chest pain 03/24/2018    Current Medications: Current Meds  Medication Sig   clopidogrel (PLAVIX) 75 MG tablet TAKE ONE  TABLET BY MOUTH DAILY WITH BREAKFAST   DEXILANT 60 MG capsule Take 1 capsule by mouth daily.   diclofenac (VOLTAREN) 50 MG EC tablet Take 50 mg by mouth 3 (three) times daily.   docusate sodium (COLACE) 100 MG capsule Take 100 mg by mouth daily.   escitalopram (LEXAPRO) 10 MG tablet Take 10 mg by mouth daily.   ezetimibe (ZETIA) 10 MG tablet Take 10 mg by mouth daily. Monday / Wednesday / Friday   hydrocortisone (ANUSOL-HC) 25 MG suppository Place 1 suppository (25 mg total) rectally 2 (two) times daily.   lansoprazole (PREVACID) 30 MG capsule Take 1 capsule (30 mg total) by mouth 2 (two) times daily before a meal.   levocetirizine (XYZAL) 5 MG tablet Take 5 mg by mouth every evening.   lisinopril (PRINIVIL,ZESTRIL) 10 MG tablet Take 10 mg by mouth every evening.    metoprolol succinate (TOPROL-XL) 50 MG 24 hr tablet Take 1 tablet (50 mg total) by mouth daily. Take with or immediately following a meal.   nitroGLYCERIN (NITROSTAT) 0.4 MG SL tablet Place 1 tablet (0.4 mg total) under the tongue every 5 (five) minutes as needed.   rosuvastatin (CRESTOR) 40 MG tablet Take 40 mg by mouth daily.   terbinafine (LAMISIL) 250 MG tablet Take 250 mg by mouth daily.   traMADol (ULTRAM) 50 MG tablet Take 150 mg by mouth as needed for pain.   Vitamin D, Ergocalciferol, (DRISDOL) 1.25 MG (50000 UNIT) CAPS capsule Take 50,000 Units by mouth once a week.   Current Facility-Administered Medications for the 07/15/23 encounter (Office Visit) with Baldo Daub, MD  Medication   0.9 %  sodium chloride infusion      EKGs/Labs/Other Studies Reviewed:    The following studies were reviewed today:  Cardiac Studies & Procedures   CARDIAC CATHETERIZATION  CARDIAC CATHETERIZATION 04/08/2018  Narrative  Mid LAD lesion is 75% stenosed.  A drug-eluting stent was successfully placed using a STENT SYNERGY DES 2.5X16.  Post intervention, there is a 0% residual stenosis.  Prox LAD lesion is 80% stenosed.  A  drug-eluting stent was successfully placed using a STENT SYNERGY DES 3X38.  Post intervention, there is a 0% residual stenosis.  Mid Cx lesion is 25% stenosed.  The left ventricular systolic function is normal.  LV end diastolic pressure is normal. LVEDP 5 mm Hg.  The left ventricular ejection fraction is 55-65% by visual estimate.  There is no aortic valve stenosis.  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 Would not attempt right radial access in the future.    Recommend uninterrupted dual antiplatelet therapy with Aspirin 81mg  daily and Clopidogrel 75mg  daily for a minimum of 6 months (stable ischemic heart disease - Class I recommendation).  Continue aggressive secondary prevention.  Watch overnight with plan for discharge in AM.  Findings Coronary Findings Diagnostic  Dominance: Right  Left Anterior Descending Prox LAD lesion is 80% stenosed. Mid LAD lesion is 75% stenosed.  Left Circumflex Mid Cx lesion is 25% stenosed.  Intervention  Prox LAD lesion Stent CATHETER LAUNCHER 6FREBU 3.75 guide catheter was inserted. Lesion crossed with guidewire using a WIRE ASAHI PROWATER 180CM. Pre-stent angioplasty was performed using a BALLOON SAPPHIRE 2.5X20. A drug-eluting stent was successfully placed using a STENT  SYNERGY DES [id number]. Stent strut is well apposed. Post-stent angioplasty was performed using a BALLOON SAPPHIRE [place] 3.5X15. Post-Intervention Lesion Assessment The intervention was successful. Pre-interventional TIMI flow is 3. Post-intervention TIMI flow is 3. No complications occurred at this lesion. There is a 0% residual stenosis post intervention.  Mid LAD lesion Stent CATHETER LAUNCHER 6FREBU 3.75 guide catheter was inserted. Lesion crossed with guidewire using a WIRE ASAHI PROWATER 180CM. Pre-stent angioplasty was not performed. A drug-eluting stent was successfully placed using a STENT SYNERGY DES 2.5X16. Stent strut is well apposed.  Post-stent angioplasty was not performed. Post-Intervention Lesion Assessment The intervention was successful. Pre-interventional TIMI flow is 3. Post-intervention TIMI flow is 3. There is a 0% residual stenosis post intervention.   STRESS TESTS  MYOCARDIAL PERFUSION IMAGING 12/22/2019  Narrative  Nuclear stress EF: 59%.  There was no ST segment deviation noted during stress.  No T wave inversion was noted during stress.  The study is normal.  This is a low risk study.              EKG Interpretation Date/Time:  Monday July 15 2023 15:17:32 EST Ventricular Rate:  82 PR Interval:  164 QRS Duration:  88 QT Interval:  344 QTC Calculation: 401 R Axis:   36  Text Interpretation: Normal sinus rhythm Normal EKG insignificant Q waves When compared with ECG of 09-Apr-2018 03:42, unchanged Confirmed by Norman Herrlich ([id number]) on 07/15/2023 3:31:03 PM     Physical Exam:    VS:  BP 132/76   Pulse 82   Ht 6' (1.829 m)   Wt 249 lb 6.4 oz (113.1 kg)   SpO2 95%   BMI 33.82 kg/m     Wt Readings from Last 3 Encounters:  07/15/23 249 lb 6.4 oz (113.1 kg)  04/03/22 258 lb (117 kg)  02/14/22 258 lb 12.8 oz (117.4 kg)     GEN:  Well nourished, well developed in no acute distress HEENT: Normal NECK: No JVD; No carotid bruits LYMPHATICS: No lymphadenopathy CARDIAC: RRR, no murmurs, rubs, gallops RESPIRATORY:  Clear to auscultation without rales, wheezing or rhonchi  ABDOMEN: Soft, non-tender, non-distended MUSCULOSKELETAL:  No edema; No deformity  SKIN: Warm and dry NEUROLOGIC:  Alert and oriented x 3 PSYCHIATRIC:  Normal affect    Signed, Norman Herrlich, MD  07/15/2023 3:46 PM     [facility]

## 2023-07-15 ENCOUNTER — Ambulatory Visit: Payer: 59 | Attending: Cardiology | Admitting: Cardiology

## 2023-07-15 ENCOUNTER — Encounter: Payer: Self-pay | Admitting: Cardiology

## 2023-07-15 VITALS — BP 132/76 | HR 82 | Ht 72.0 in | Wt 249.4 lb

## 2023-07-15 DIAGNOSIS — I10 Essential (primary) hypertension: Secondary | ICD-10-CM

## 2023-07-15 DIAGNOSIS — E7800 Pure hypercholesterolemia, unspecified: Secondary | ICD-10-CM | POA: Diagnosis not present

## 2023-07-15 DIAGNOSIS — I25118 Atherosclerotic heart disease of native coronary artery with other forms of angina pectoris: Secondary | ICD-10-CM

## 2023-07-15 NOTE — Patient Instructions (Signed)
Medication Instructions:  Your physician recommends that you continue on your current medications as directed. Please refer to the Current Medication list given to you today.  *If you need a refill on your cardiac medications before your next appointment, please call your pharmacy*   Lab Work: None If you have labs (blood work) drawn today and your tests are completely normal, you will receive your results only by: MyChart Message (if you have MyChart) OR A paper copy in the mail If you have any lab test that is abnormal or we need to change your treatment, we will call you to review the results.   Testing/Procedures: None   Follow-Up: At [facility], you and your health needs are our priority.  As part of our continuing mission to provide you with exceptional heart care, we have created designated Provider Care Teams.  These Care Teams include your primary Cardiologist (physician) and Advanced Practice Providers (APPs -  Physician Assistants and Nurse Practitioners) who all work together to provide you with the care you need, when you need it.  We recommend signing up for the patient portal called "MyChart".  Sign up information is provided on this After Visit Summary.  MyChart is used to connect with patients for Virtual Visits (Telemedicine).  Patients are able to view lab/test results, encounter notes, upcoming appointments, etc.  Non-urgent messages can be sent to your provider as well.   To learn more about what you can do with MyChart, go to [email or web address].    Your next appointment:   1 year(s)  Provider:   Norman Herrlich, MD    Other Instructions None

## 2023-10-24 ENCOUNTER — Encounter: Payer: Self-pay | Admitting: Neurology

## 2023-12-16 NOTE — Progress Notes (Signed)
 Initial neurology clinic note  Reason for Evaluation: Consultation requested by King, Aaron E, FNP for an opinion regarding numbness and tingling in arms and legs. My final recommendations will be communicated back to the requesting physician by way of shared medical record or letter to requesting physician via [place]  mail.  HPI: This is [name], a 58 y.o. right-handed male with a medical history of HTN, HLD, CAD, pre-DM, fibromyalgia, vit D deficiency, anxiety, OSA (on CPAP) who presents to neurology clinic with the chief complaint of numbness and tingling in arms and legs. The patient is alone today.  Patient first noticed numbness and tingling in his right arm (whole arm) at the beginning of 2025, mostly when using his arm. The left arm is less noticeable. 2-3 months later he noticed tingling, burning in his legs (mostly feet), like they were going to sleep, if he put his legs in difference positions. He thinks both legs are similar (one not worse than the other). The symptoms would come and go. If he tries to sleep on his left side, the entire right arm will have the going to sleep sensation. He denies muscle weakness. Symptoms have been better lately (over the last month), which he describes as bothering him less.   Patient went to PT once for his low back about 3 weeks ago. He did not find it helpful, so he did not return.  Patient takes Lyrica 150 mg BID for fibromyalgia which he has been taking for a while. He was diagnosed with fibromyalgia due to headaches and body aches. He continues to have this. He describes the headaches as left sided head and neck pain mostly. It is pressure and tightness with associated lightheadedness. He has headaches every day. He typically wakes with a headache. It lasts most of the day, though it comes and goes. He denies photophobia. He may have some phonophobia. He is unsure about nausea. He is prescribed amitriptyline and nortriptyline for  headaches, but he is only taking amitriptyline, which he started recently. He takes amitriptyline 50 mg at night, but he still does not sleep well. He has OSA and is on CPAP, but does not feel like he rests well at night. He has not seen sleep medicine in years.  He does not take any supplements.  The patient does not report symptoms referable to autonomic dysfunction including impaired sweating, heat or cold intolerance, excessive mucosal dryness, gastroparetic early satiety, postprandial abdominal bloating, constipation, bowel or bladder dyscontrol, erectile dysfunction, or syncope/presyncope/orthostatic intolerance.  He does not report any constitutional symptoms like fever, night sweats, anorexia or unintentional weight loss.  EtOH use: none  Restrictive diet? no Family history of neurologic disease? No   MEDICATIONS:  Outpatient Encounter Medications as of 12/27/2023  Medication Sig   clopidogrel  (PLAVIX ) 75 MG tablet TAKE ONE TABLET BY MOUTH DAILY WITH BREAKFAST   DEXILANT  60 MG capsule Take 1 capsule by mouth daily.   diclofenac (VOLTAREN) 50 MG EC tablet Take 50 mg by mouth 3 (three) times daily.   docusate sodium (COLACE) 100 MG capsule Take 100 mg by mouth daily.   escitalopram (LEXAPRO) 10 MG tablet Take 10 mg by mouth daily.   ezetimibe (ZETIA) 10 MG tablet Take 10 mg by mouth daily. Monday / Wednesday / Friday   hydrocortisone  (ANUSOL -HC) 25 MG suppository Place 1 suppository (25 mg total) rectally 2 (two) times daily.   lansoprazole  (PREVACID ) 30 MG capsule Take 1 capsule (30 mg total) by mouth 2 (two)  times daily before a meal.   levocetirizine (XYZAL ) 5 MG tablet Take 5 mg by mouth every evening.   lisinopril  (PRINIVIL ,ZESTRIL ) 10 MG tablet Take 10 mg by mouth every evening.    metoprolol  succinate (TOPROL -XL) 50 MG 24 hr tablet Take 1 tablet (50 mg total) by mouth daily. Take with or immediately following a meal.   nitroGLYCERIN  (NITROSTAT ) 0.4 MG SL tablet Place 1 tablet  (0.4 mg total) under the tongue every 5 (five) minutes as needed.   rosuvastatin (CRESTOR) 40 MG tablet Take 40 mg by mouth daily.   terbinafine (LAMISIL) 250 MG tablet Take 250 mg by mouth daily.   traMADol (ULTRAM) 50 MG tablet Take 150 mg by mouth as needed for pain.   Vitamin D , Ergocalciferol , (DRISDOL) 1.25 MG (50000 UNIT) CAPS capsule Take 50,000 Units by mouth once a week.   Facility-Administered Encounter Medications as of 12/27/2023  Medication   0.9 %  sodium chloride  infusion    PAST MEDICAL HISTORY: Past Medical History:  Diagnosis Date   Abnormal finding on lung imaging 03/31/2018   October 2019 CT chest  showing normal pulmonary parenchyma in the upper lobes but in the bases there is atelectasis and some airway thickening with surrounding groundglass.  Compared to in April 2018 CT scan of his abdomen this does appear to be progressive.  10/07/2018-chest x-ray ([place] [facility])- chronic appearing lingular opacity, no acute infiltrate edema mass or effusion otherwise, chr   Anxiety 03/24/2018   Benign essential hypertension 03/24/2018   CAD (coronary artery disease)    s/p DES to pLAD & mLAD 04/08/18   Chest pain in adult 03/24/2018   Chlorine gas exposure 10/08/2018   Depression 03/24/2018   Frequent headaches 03/24/2018   GAD (generalized anxiety disorder) 04/05/2018   GERD (gastroesophageal reflux disease) 03/24/2018   Hypercholesterolemia 03/24/2018   Hypotestosteronemia 03/24/2018   OSA (obstructive sleep apnea) 10/08/2018   Split-night sleep study January 2020 AHI 24.3 with optimal control on CPAP 12 cm H2O.    Shortness of breath 10/08/2018   10/08/2018-CT chest without contrast- lingular scarring, no confluent opacities or effusions otherwise  12/10/2018-pulmonary function test- FVC 4.15 (79% predicted), postbronchodilator ratio 84, postbronchodilator FEV1 3.72 (91% predicted), no bronchodilator response, mid flow reversibility, DLCO 94   Sleep apnea    Substernal chest  pain 03/24/2018    PAST SURGICAL HISTORY: Past Surgical History:  Procedure Laterality Date   CARDIAC CATHETERIZATION     COLONOSCOPY  11/23/2016   Colon polyps. Diverticulosis of colon. Internal hemorrhoids.    CORONARY STENT INTERVENTION N/A 04/08/2018   Procedure: CORONARY STENT INTERVENTION;  Surgeon: Dann Candyce RAMAN, MD;  Location[facility] INVASIVE CV LAB;  Service: Cardiovascular;  Laterality: N/A;   LEFT HEART CATH AND CORONARY ANGIOGRAPHY N/A 04/08/2018   Procedure: LEFT HEART CATH AND CORONARY ANGIOGRAPHY;  Surgeon: Dann Candyce RAMAN, MD;  Location: [facility] INVASIVE CV LAB;  Service: Cardiovascular;  Laterality: N/A;   UPPER GASTROINTESTINAL ENDOSCOPY      ALLERGIES: Allergies  Allergen Reactions   Citalopram Other (See Comments)    ineffective   Sertraline Other (See Comments)    ineffective   Venlafaxine Other (See Comments)    limited efficacy    FAMILY HISTORY: Family History  Problem Relation Age of Onset   Hypertension Mother    ALS Father    Stroke Brother    Heart disease Neg Hx    Cancer Neg Hx    Diabetes Neg Hx    Colon cancer Neg  Hx    Esophageal cancer Neg Hx    Stomach cancer Neg Hx    Rectal cancer Neg Hx     SOCIAL HISTORY: Social History   Tobacco Use   Smoking status: Never   Smokeless tobacco: Never  Vaping Use   Vaping status: Never Used  Substance Use Topics   Alcohol use: Not Currently   Drug use: Not Currently   Social History   Social History Narrative   Not on file     OBJECTIVE: PHYSICAL EXAM: BP 107/72   Pulse 76   Ht 6' 6 (1.981 m)   Wt 230 lb (104.3 kg)   SpO2 97%   BMI 26.58 kg/m   General: General appearance: Awake and alert. No distress. Cooperative with exam.  Skin: No obvious rash or jaundice. HEENT: Atraumatic. Anicteric. Pain/discomfort with turning head to left. Paraspinal/trapezius tightness. Lungs: Non-labored breathing on room air  Heart: Regular Extremities: No edema. No obvious  deformity.  Musculoskeletal: No obvious joint swelling. Psych: Affect appropriate.  Neurological: Mental Status: Alert. Speech fluent. No pseudobulbar affect Cranial Nerves: CNII: No RAPD. Visual fields grossly intact. CNIII, IV, VI: PERRL. No nystagmus. EOMI. CN V: Facial sensation intact bilaterally to fine touch. CN VII: Facial muscles symmetric and strong. No ptosis at rest. CN VIII: Hearing grossly intact bilaterally. CN IX: No hypophonia. CN X: Palate elevates symmetrically. CN XI: Full strength shoulder shrug bilaterally. CN XII: Tongue protrusion full and midline. No atrophy or fasciculations. No significant dysarthria Motor: Tone is normal. No atrophy.   Individual muscle group testing (MRC grade out of 5):  Movement     Neck flexion 5    Neck extension 5     Right Left   Shoulder abduction 5 5   Elbow flexion 5 5   Elbow extension 5 5   Finger abduction - FDI 5 5   Finger abduction - ADM 5 5   Finger extension 5 5   Finger distal flexion - 2/3 5 5    Finger distal flexion - 4/5 5 5    Thumb flexion - FPL 5 5   Thumb abduction - APB 5 5    Hip flexion 5 5   Hip extension 5 5   Hip adduction 5 5   Hip abduction 5 5   Knee extension 5 5   Knee flexion 5 5   Dorsiflexion 5 5   Plantarflexion 5 5    Reflexes:  Right Left   Bicep 1+ 1+   Tricep 1+ 1+   BrRad 1+ 1+   Knee 1+ 1+   Ankle 1+ 1+    Pathological Reflexes: Babinski: flexor response bilaterally Hoffman: absent bilaterally Troemner: absent bilaterally Sensation: Pinprick: Intact in all extremities Vibration: Absent in bilateral great toes, diminished in bilateral ankles, otherwise intact Proprioception: Intact in bilateral great toes Coordination: Intact finger-to- nose-finger bilaterally. Romberg negative. Gait: Able to rise from chair with arms crossed unassisted. Normal, narrow-based gait.  Lab and Test Review: No recent labs or tests for review.  ASSESSMENT: Aaron Reese is a 58  y.o. male who presents for evaluation of numbness and tingling in RUE and bilateral feet, headaches, neck pain, and poor sleep. He has a relevant medical history of [place], HLD, CAD, pre-DM, fibromyalgia, vit D deficiency, anxiety, OSA (on CPAP). His neurological examination is pertinent for neck tightness and diminished vibratory sensation in his feet. He has no recent testing/labs for review. His headaches may be OSA related as they occur in the  morning and he reports not feeling refreshed after sleep. He has not seen sleep medicine in many years. There is also likely contributions from cervicalgia. In terms of his numbness and tingling, the etiology is less clear. He has neck and back pain, so radiculopathy is possible, but symptoms could also represent a distal symmetric polyneuropathy as well (or both). I will work up as below.  PLAN: -Blood work: B1, B12, HbA1c, IFE -EMG: PN right side -Sleep medicine referral to follow up on OSA given poor quality of sleep lately -Physical therapy for cervicalgia -Do not take nortriptyline anymore. Okay to continue amitriptyline 50 mg at bedtime for now. -Continue Lyrica 150 mg BID  -Return to clinic in 3 months  The impression above as well as the plan as outlined below were extensively discussed with the patient who voiced understanding. All questions were answered to their satisfaction.  When available, results of the above investigations and possible further recommendations will be communicated to the patient via telephone/MyChart. Patient to call office if not contacted after expected testing turnaround time.   Total time spent reviewing records, interview, history/exam, documentation, and coordination of care on day of encounter:  60 min   Thank you for allowing me to participate in patient's care.  If I can answer any additional questions, I would be pleased to do so.  Venetia Potters, MD   CC: King, Aaron E, FNP [address] [place]  [address]  CC: Referring provider: King, Aaron E, FNP [address] [place],  [place] [address]

## 2023-12-27 ENCOUNTER — Other Ambulatory Visit

## 2023-12-27 ENCOUNTER — Encounter: Payer: Self-pay | Admitting: Neurology

## 2023-12-27 ENCOUNTER — Ambulatory Visit: Admitting: Neurology

## 2023-12-27 VITALS — BP 107/72 | HR 76 | Ht 78.0 in | Wt 230.0 lb

## 2023-12-27 DIAGNOSIS — R7303 Prediabetes: Secondary | ICD-10-CM

## 2023-12-27 DIAGNOSIS — R200 Anesthesia of skin: Secondary | ICD-10-CM | POA: Diagnosis not present

## 2023-12-27 DIAGNOSIS — G4486 Cervicogenic headache: Secondary | ICD-10-CM

## 2023-12-27 DIAGNOSIS — M542 Cervicalgia: Secondary | ICD-10-CM | POA: Diagnosis not present

## 2023-12-27 DIAGNOSIS — G4733 Obstructive sleep apnea (adult) (pediatric): Secondary | ICD-10-CM | POA: Diagnosis not present

## 2023-12-27 DIAGNOSIS — R202 Paresthesia of skin: Secondary | ICD-10-CM

## 2023-12-27 NOTE — Patient Instructions (Signed)
 I saw you today for numbness and tingling in your feet and right arm. We also discussed neck pain, headaches, and sleep apnea.  I would like to do the following: -Blood work today -Schedule you for nerve testing called EMG (see more information below) -Refer you to sleep medicine to check in on your sleep apnea. You may need adjustments. This could be contributing to your headaches -Refer you to physical therapy for your neck pain. This is also probably contributing to your headaches.  STOP NORTRIPTYLINE. You can continue amitriptyline 50 mg at bedtime but should not take nortriptyline as well.  Continue Lyrica 150 mg twice daily.  We will discuss your results and next steps when I have them.  I will see you back in clinic in 3 months. Please let me know if you have any questions or concerns in the meantime.  The physicians and staff at [facility] are committed to providing excellent care. You may receive a survey requesting feedback about your experience at our office. We strive to receive very good responses to the survey questions. If you feel that your experience would prevent you from giving the office a very good  response, please contact our office to try to remedy the situation. We may be reached at [phone]. Thank you for taking the time out of your busy day to complete the survey.  Venetia Potters, MD [facility]  ELECTROMYOGRAM AND NERVE CONDUCTION STUDIES (EMG/NCS) INSTRUCTIONS  How to Prepare The neurologist conducting the EMG will need to know if you have certain medical conditions. Tell the neurologist and other EMG lab personnel if you: Have a pacemaker or any other electrical medical device Take blood-thinning medications Have hemophilia, a blood-clotting disorder that causes prolonged bleeding Bathing Take a shower or bath shortly before your exam in order to remove oils from your skin. Don't apply lotions or creams before the exam.  What to  Expect You'll likely be asked to change into a hospital gown for the procedure and lie down on an examination table. The following explanations can help you understand what will happen during the exam.  Electrodes. The neurologist or a technician places surface electrodes at various locations on your skin depending on where you're experiencing symptoms. Or the neurologist may insert needle electrodes at different sites depending on your symptoms.  Sensations. The electrodes will at times transmit a tiny electrical current that you may feel as a twinge or spasm. The needle electrode may cause discomfort or pain that usually ends shortly after the needle is removed. If you are concerned about discomfort or pain, you may want to talk to the neurologist about taking a short break during the exam.  Instructions. During the needle EMG, the neurologist will assess whether there is any spontaneous electrical activity when the muscle is at rest - activity that isn't present in healthy muscle tissue - and the degree of activity when you slightly contract the muscle.  He or she will give you instructions on resting and contracting a muscle at appropriate times. Depending on what muscles and nerves the neurologist is examining, he or she may ask you to change positions during the exam.  After your EMG You may experience some temporary, minor bruising where the needle electrode was inserted into your muscle. This bruising should fade within several days. If it persists, contact your primary care doctor.

## 2024-01-07 LAB — IMMUNOFIXATION ELECTROPHORESIS
IgG (Immunoglobin G), Serum: 831 mg/dL (ref 600–1640)
IgM, Serum: 108 mg/dL (ref 50–300)
Immunoglobulin A: 170 mg/dL (ref 47–310)

## 2024-01-07 LAB — HEMOGLOBIN A1C
Hgb A1c MFr Bld: 5.4 % (ref <5.7)
Mean Plasma Glucose: 108 mg/dL
eAG (mmol/L): 6 mmol/L

## 2024-01-07 LAB — VITAMIN B1: Vitamin B1 (Thiamine): 9 nmol/L (ref 8–30)

## 2024-01-07 LAB — VITAMIN B12: Vitamin B-12: 245 pg/mL (ref 200–1100)

## 2024-01-08 ENCOUNTER — Ambulatory Visit: Payer: Self-pay | Admitting: Neurology

## 2024-01-22 ENCOUNTER — Telehealth: Payer: Self-pay

## 2024-01-22 NOTE — Telephone Encounter (Signed)
 [facility] sent letter pt no showed to therapy.

## 2024-02-04 ENCOUNTER — Ambulatory Visit: Admitting: Neurology

## 2024-02-04 ENCOUNTER — Encounter: Payer: Self-pay | Admitting: Neurology

## 2024-02-04 ENCOUNTER — Other Ambulatory Visit: Payer: Self-pay

## 2024-02-04 DIAGNOSIS — G5601 Carpal tunnel syndrome, right upper limb: Secondary | ICD-10-CM

## 2024-02-04 DIAGNOSIS — R200 Anesthesia of skin: Secondary | ICD-10-CM

## 2024-02-04 DIAGNOSIS — M5417 Radiculopathy, lumbosacral region: Secondary | ICD-10-CM

## 2024-02-04 DIAGNOSIS — R202 Paresthesia of skin: Secondary | ICD-10-CM

## 2024-02-04 NOTE — Procedures (Signed)
 [facility]  [address] [place], [address]  [place], [place] [address] Tel: [phone] Fax: [phone] Test Date:  02/04/2024  Patient: Aaron Reese DOB: 11-Feb-1966 Physician: Venetia Potters, MD  Sex: Male Height: 6' 0 Ref Phys: Venetia Potters, MD  ID#: [id number]   Technician:    History: This is a 58 year old male with numbness and tingling in the right upper limb and bilateral feet.  NCV & EMG Findings: Extensive electrodiagnostic evaluation of the right upper and lower limbs with additional nerve conduction studies of the left upper and lower limbs shows: Right median sensory response shows prolonged distal peak latency (3.7 ms). Right sural, right superficial peroneal/fibular, right ulnar, right radial, left median, and left median-ulnar palmar sensory responses are within normal limits. Right peroneal/fibular (EDB) motor response shows reduced amplitude (1.27 mV). Right tibial (AH), right peroneal/fibular (TA), right median (APB), right ulnar (ADM), and left peroneal/fibular (EDB) motor responses are within normal limits. Right H reflex latency is within normal limits. Chronic motor axon loss changes without accompanying active denervation changes are seen in the right tibialis anterior, right flexor digitorum longus, right gluteus medius, and right lumbosacral paraspinal (L5 level) muscles.   Impression: This is an abnormal study. The findings are most consistent with the following: The residuals of an old intraspinal canal lesion (ie: motor radiculopathy) at the right L5 root or segment, mild in degree electrically. Right median mononeuropathy at or distal to the wrist, consistent with carpal tunnel syndrome, mild in degree electrically. No electrodiagnostic evidence of a large fiber sensorimotor neuropathy. No electrodiagnostic evidence of a right cervical (C5-C8) motor radiculopathy. No electrodiagnostic evidence of a left median mononeuropathy at or distal to the wrist  (ie: carpal tunnel syndrome).    ___________________________ Venetia Potters, MD    Nerve Conduction Studies Motor Nerve Results    Latency Amplitude F-Lat Segment Distance CV Comment  Site (ms) Norm (mV) Norm (ms)  (cm) (m/s) Norm   Left Fibular (EDB) Motor  Ankle 4.1  < 6.0 3.0  > 2.5        Right Fibular (EDB) Motor  Ankle 5.9  < 6.0 *1.27  > 2.5        Bel fib head 14.3 - 1.11 -  Bel fib head-Ankle 35 42  > 40   Pop fossa 16.1 - 0.78 -  Pop fossa-Bel fib head 8 44 -   Right Fibular (TA) Motor  Fib head 2.4  < 4.5 5.8  > 3.0        Pop fossa 3.7  < 6.7 5.7 -  Pop fossa-Fib head 9 69  > 40   Right Median (APB) Motor  Wrist 3.0  < 4.0 10.1  > 6.0        Elbow 8.1 - 9.9 -  Elbow-Wrist 28.5 56  > 50   Right Tibial (AH) Motor  Ankle 3.8  < 6.0 11.2  > 4.0        Knee 13.0 - 7.3 -  Knee-Ankle 46 50  > 40   Right Ulnar (ADM) Motor  Wrist 1.88  < 3.1 10.9  > 7.0        Bel elbow 6.1 - 10.4 -  Bel elbow-Wrist 24 57  > 50   Ab elbow 7.9 - 10.1 -  Ab elbow-Bel elbow 10 56 -    Sensory Sites    Neg Peak Lat Amplitude (O-P) Segment Distance Velocity Comment  Site (ms) Norm (V) Norm  (cm) (ms)  Left Median Sensory  Wrist-Dig II 3.3  < 3.6 19  > 15 Wrist-Dig II 13    Right Median Sensory  Wrist-Dig II *3.7  < 3.6 20  > 15 Wrist-Dig II 13    Left Median-Ulnar Palmar Sensory       Median  Palm-Wrist 2.2  < 2.2 49  > 10 Palm-Wrist 8         Ulnar  Palm-Wrist 1.98  < 2.2 12  > 5 Palm-Wrist 8    Right Radial Sensory  Forearm-Wrist 2.2  < 2.7 14  > 14 Forearm-Wrist 10    Right Superficial Fibular Sensory  14 cm-Ankle 2.9  < 4.6 5  > 4 14 cm-Ankle 14    Right Sural Sensory  Calf-Lat mall 3.9  < 4.6 5  > 4 Calf-Lat mall 14    Right Ulnar Sensory  Wrist-Dig V 3.0  < 3.1 23  > 10 Wrist-Dig V 11     H-Reflex Results    M-Lat H Lat H Neg Amp H-M Lat  Site (ms) (ms) Norm (mV) (ms)  Right Tibial H-Reflex  Pop fossa 6.5 31.7  < 35.0 1.52 25.2   Inter-Nerve Comparisons   Nerve 1  Value 1 Nerve 2 Value 2 Parameter Result Normal  Sensory Sites  L Median Palm-Wrist 2.2 ms L Ulnar Palm-Wrist 1.98 ms Peak Lat Diff 0.22 ms <0.40   Electromyography   Side Muscle Ins.Act Fibs Fasc Recrt Amp Dur Poly Activation Comment  Right Tib ant Nml Nml Nml *1- *1+ *1+ Nml Nml N/A  Right FDL Nml Nml Nml *1- *1+ *1+ *1+ Nml N/A  Right L5 PSP Nml Nml Nml *1- *1+ *1+ Nml Nml N/A  Right Gluteus med Nml Nml Nml *1- *1+ *1+ *1+ Nml N/A  Right Gastroc MH Nml Nml Nml Nml Nml Nml Nml Nml N/A  Right Biceps fem SH Nml Nml Nml Nml Nml Nml Nml Nml N/A  Right Rectus fem Nml Nml Nml Nml Nml Nml Nml Nml N/A  Right FDI Nml Nml Nml Nml Nml Nml Nml Nml N/A  Right EIP Nml Nml Nml Nml Nml Nml Nml Nml N/A  Right Pronator teres Nml Nml Nml Nml Nml Nml Nml Nml N/A  Right Biceps Nml Nml Nml Nml Nml Nml Nml Nml N/A  Right Triceps lat hd Nml Nml Nml Nml Nml Nml Nml Nml N/A  Right Deltoid Nml Nml Nml Nml Nml Nml Nml Nml N/A      Waveforms:  Motor               Sensory                  H-Reflex

## 2024-02-04 NOTE — Addendum Note (Signed)
 Addended by: TERRIL CHARLIES MATSU on: 02/04/2024 10:05 AM   Modules accepted: Orders

## 2024-02-07 NOTE — Progress Notes (Signed)
 No PA needed for CPT 72148. Confirmation# [id number]

## 2024-02-20 ENCOUNTER — Inpatient Hospital Stay (HOSPITAL_BASED_OUTPATIENT_CLINIC_OR_DEPARTMENT_OTHER)
Admission: RE | Admit: 2024-02-20 | Discharge: 2024-02-20 | Discharge status: Home / Self Care | Payer: Self-pay | Source: Ambulatory Visit | Attending: Neurology | Admitting: Radiology

## 2024-02-20 DIAGNOSIS — R531 Weakness: Secondary | ICD-10-CM

## 2024-02-20 DIAGNOSIS — M545 Low back pain, unspecified: Secondary | ICD-10-CM | POA: Diagnosis not present

## 2024-02-20 DIAGNOSIS — M5417 Radiculopathy, lumbosacral region: Secondary | ICD-10-CM

## 2024-02-24 ENCOUNTER — Ambulatory Visit: Payer: Self-pay | Admitting: Neurology

## 2024-02-25 ENCOUNTER — Other Ambulatory Visit: Payer: Self-pay

## 2024-02-25 DIAGNOSIS — M542 Cervicalgia: Secondary | ICD-10-CM

## 2024-02-25 DIAGNOSIS — M5417 Radiculopathy, lumbosacral region: Secondary | ICD-10-CM

## 2024-02-25 DIAGNOSIS — R200 Anesthesia of skin: Secondary | ICD-10-CM

## 2024-03-04 ENCOUNTER — Ambulatory Visit

## 2024-03-19 NOTE — Progress Notes (Signed)
 Referring Physician:  Georjean Darice HERO, MD [address] [place],  [place] [address]  Primary Physician:  Myrna Arlyne BRAVO, FNP  History of Present Illness: 03/23/2024 [name] has a history of HTN, CAD, OSA, GERD, depression, hypercholesterolemia, GAD.   He has 6 month history of constant LBP with intermittent bilateral posterior leg pain to his feet, right > left. He has numbness/tingling in his feet. Pain is better with rest. Pain is worse with prolonged sitting/standing/walking.   Thinks he had injury to back as a child/teenager and it's been hurting intermittent since that time.   Some relief with ultram. He is on PLAVIX .   Tobacco use: Does not smoke.   Bowel/Bladder Dysfunction: none  Conservative measures:  Physical therapy:  Went to PT 1 time and never went back Multimodal medical therapy including regular antiinflammatories:  Lyrica, Diclofenac Injections: no epidural steroid injections  Past Surgery: none  Jemel Seifert has no symptoms of cervical myelopathy.  The symptoms are causing a significant impact on the patient's life.   Review of Systems:  A 10 point review of systems is negative, except for the pertinent positives and negatives detailed in the HPI.  Past Medical History: Past Medical History:  Diagnosis Date   Abnormal finding on lung imaging 03/31/2018   October 2019 CT chest  showing normal pulmonary parenchyma in the upper lobes but in the bases there is atelectasis and some airway thickening with surrounding groundglass.  Compared to in April 2018 CT scan of his abdomen this does appear to be progressive.  10/07/2018-chest x-ray ([place] [facility])- chronic appearing lingular opacity, no acute infiltrate edema mass or effusion otherwise, chr   Anxiety 03/24/2018   Benign essential hypertension 03/24/2018   CAD (coronary artery disease)    s/p DES to pLAD & mLAD 04/08/18   Chest pain in adult 03/24/2018   Chlorine gas  exposure 10/08/2018   Depression 03/24/2018   Frequent headaches 03/24/2018   GAD (generalized anxiety disorder) 04/05/2018   GERD (gastroesophageal reflux disease) 03/24/2018   Hypercholesterolemia 03/24/2018   Hypotestosteronemia 03/24/2018   OSA (obstructive sleep apnea) 10/08/2018   Split-night sleep study January 2020 AHI 24.3 with optimal control on CPAP 12 cm H2O.    Shortness of breath 10/08/2018   10/08/2018-CT chest without contrast- lingular scarring, no confluent opacities or effusions otherwise  12/10/2018-pulmonary function test- FVC 4.15 (79% predicted), postbronchodilator ratio 84, postbronchodilator FEV1 3.72 (91% predicted), no bronchodilator response, mid flow reversibility, DLCO 94   Sleep apnea    Substernal chest pain 03/24/2018    Past Surgical History: Past Surgical History:  Procedure Laterality Date   CARDIAC CATHETERIZATION     COLONOSCOPY  11/23/2016   Colon polyps. Diverticulosis of colon. Internal hemorrhoids.    CORONARY STENT INTERVENTION N/A 04/08/2018   Procedure: CORONARY STENT INTERVENTION;  Surgeon: Dann Candyce RAMAN, MD;  Location: [facility] INVASIVE CV LAB;  Service: Cardiovascular;  Laterality: N/A;   LEFT HEART CATH AND CORONARY ANGIOGRAPHY N/A 04/08/2018   Procedure: LEFT HEART CATH AND CORONARY ANGIOGRAPHY;  Surgeon: Dann Candyce RAMAN, MD;  Location: [facility] INVASIVE CV LAB;  Service: Cardiovascular;  Laterality: N/A;   UPPER GASTROINTESTINAL ENDOSCOPY      Allergies: Allergies as of 03/23/2024 - Review Complete 12/27/2023  Allergen Reaction Noted   Citalopram Other (See Comments) 03/24/2018   Sertraline Other (See Comments) 03/24/2018   Venlafaxine Other (See Comments) 03/24/2018    Medications: Outpatient Encounter Medications as of 03/23/2024  Medication Sig  clopidogrel  (PLAVIX ) 75 MG tablet TAKE ONE TABLET BY MOUTH DAILY WITH BREAKFAST   docusate sodium (COLACE) 100 MG capsule Take 100 mg by mouth daily.   escitalopram (LEXAPRO) 10 MG tablet  Take 10 mg by mouth daily.   ezetimibe (ZETIA) 10 MG tablet Take 10 mg by mouth daily. Monday / Wednesday / Friday   lisinopril  (PRINIVIL ,ZESTRIL ) 10 MG tablet Take 10 mg by mouth every evening.    metoprolol  succinate (TOPROL -XL) 50 MG 24 hr tablet Take 1 tablet (50 mg total) by mouth daily. Take with or immediately following a meal.   nitroGLYCERIN  (NITROSTAT ) 0.4 MG SL tablet Place 1 tablet (0.4 mg total) under the tongue every 5 (five) minutes as needed.   rosuvastatin (CRESTOR) 40 MG tablet Take 40 mg by mouth daily.   traMADol (ULTRAM) 50 MG tablet Take 150 mg by mouth as needed for pain.   ZEPBOUND 10 MG/0.5ML Pen SMARTSIG:1 unspecified SUB-Q Once a Week   [DISCONTINUED] amitriptyline (ELAVIL) 50 MG tablet Take 50 mg by mouth at bedtime.   [DISCONTINUED] DEXILANT  60 MG capsule Take 1 capsule by mouth daily.   [DISCONTINUED] diclofenac (VOLTAREN) 50 MG EC tablet Take 50 mg by mouth 3 (three) times daily. (Patient not taking: Reported on 12/27/2023)   [DISCONTINUED] hydrocortisone  (ANUSOL -HC) 25 MG suppository Place 1 suppository (25 mg total) rectally 2 (two) times daily.   [DISCONTINUED] lansoprazole  (PREVACID ) 30 MG capsule Take 1 capsule (30 mg total) by mouth 2 (two) times daily before a meal. (Patient not taking: Reported on 12/27/2023)   [DISCONTINUED] levocetirizine (XYZAL ) 5 MG tablet Take 5 mg by mouth every evening. (Patient not taking: Reported on 12/27/2023)   [DISCONTINUED] nortriptyline (PAMELOR) 10 MG capsule Take 10 mg by mouth at bedtime.   [DISCONTINUED] terbinafine (LAMISIL) 250 MG tablet Take 250 mg by mouth daily. (Patient not taking: Reported on 12/27/2023)   [DISCONTINUED] Vitamin D , Ergocalciferol , (DRISDOL) 1.25 MG (50000 UNIT) CAPS capsule Take 50,000 Units by mouth once a week. (Patient not taking: Reported on 12/27/2023)   Facility-Administered Encounter Medications as of 03/23/2024  Medication   0.9 %  sodium chloride  infusion    Social History: Social History    Tobacco Use   Smoking status: Never   Smokeless tobacco: Never  Vaping Use   Vaping status: Never Used  Substance Use Topics   Alcohol use: Not Currently   Drug use: Not Currently    Family Medical History: Family History  Problem Relation Age of Onset   Hypertension Mother    ALS Father    Stroke Brother    Heart disease Neg Hx    Cancer Neg Hx    Diabetes Neg Hx    Colon cancer Neg Hx    Esophageal cancer Neg Hx    Stomach cancer Neg Hx    Rectal cancer Neg Hx     Physical Examination: Vitals:   03/23/24 1420  BP: 110/76    General: Patient is well developed, well nourished, calm, collected, and in no apparent distress. Attention to examination is appropriate.  Respiratory: Patient is breathing without any difficulty.   NEUROLOGICAL:     Awake, alert, oriented to person, place, and time.  Speech is clear and fluent. Fund of knowledge is appropriate.   Cranial Nerves: Pupils equal round and reactive to light.  Facial tone is symmetric.    No posterior lumbar tenderness.   No abnormal lesions on exposed skin.   Strength: Side Biceps Triceps Deltoid Interossei Grip Wrist Ext. Wrist  Flex.  R 5 5 5 5 5 5 5   L 5 5 5 5 5 5 5    Side Iliopsoas Quads Hamstring PF DF EHL  R 5 5 5 5 5 5   L 5 5 5 5 5 5    Reflexes are 2+ and symmetric at the biceps, brachioradialis, patella and achilles.   Hoffman's is absent.  Clonus is not present.   Bilateral upper and lower extremity sensation is intact to light touch.     No pain with IR/ER of both hips.   Gait is normal.    Medical Decision Making  Imaging: EMG of bilateral upper and lower extremities dated 02/04/24:  Impression: This is an abnormal study. The findings are most consistent with the following: The residuals of an old intraspinal canal lesion (ie: motor radiculopathy) at the right L5 root or segment, mild in degree electrically. Right median mononeuropathy at or distal to the wrist, consistent with  carpal tunnel syndrome, mild in degree electrically. No electrodiagnostic evidence of a large fiber sensorimotor neuropathy. No electrodiagnostic evidence of a right cervical (C5-C8) motor radiculopathy. No electrodiagnostic evidence of a left median mononeuropathy at or distal to the wrist (ie: carpal tunnel syndrome).       ___________________________ Venetia Potters, MD   Lumbar MRI dated 02/20/24:  FINDINGS: Bone marrow: No significant abnormality   Conus and cauda equina: No significant abnormality   Paraspinal tissues: No significant abnormality   L1-L2: Normal   L2-L3: Normal   L3-L4: Mild degenerative disc disease with mild disc bulge. No significant facet disease   L4-L5: Normal   L5-S1: There are chronic bilateral pars interarticularis fractures without spondylolisthesis. No spinal stenosis or foraminal stenosis   IMPRESSION: 1. 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 Mild degenerative disc disease with mild disc bulge at L3-4 3. Otherwise normal     Electronically Signed   By: Nancyann Burns M.D.   On: 02/24/2024 10:24   I have personally reviewed the images and agree with the above interpretation.  Assessment and Plan: Mr. Mistry thinks he had injury to back as a child/teenager and it's been hurting intermittent since that time.   Now with 6 month history of constant LBP with intermittent bilateral posterior leg pain to his feet, right > left. He has numbness/tingling in his feet. Pain is better with rest. Pain is worse with prolonged sitting/standing/walking.   He has known bilateral pars at L5 with no central or foraminal stenosis. He likely has slip at L5-S1, but not easily seen on lumbar MRI.   Treatment options discussed with patient and following plan made:   - Recommend lumbar xrays with flexion/extension views. He wants to get with his NP at work. Wrote down specific xrays I need. He will likely drop off CD  with images. If any issues, he'll let me know and I'll put in orders for [facility]. Did not want to get xrays done today.  - Depending on xrays, may consider PT and/or injections. Will likely review with Dr. Claudene as well considering EMG results.  - Will plan for phone or MyChart visit to discuss xray results.   - Numbness and tingling in right hand is intermittent and tolerable. Will watch this for now. He does not want to see Dr. Claudene to discuss further options for carpal tunnel.   I spent a total of 35 minutes in face-to-face and non-face-to-face activities related to this patient's care today including review of outside records, review of  imaging, review of symptoms, physical exam, discussion of differential diagnosis, discussion of treatment options, and documentation.   Thank you for involving me in the care of this patient.   Glade Boys PA-C Dept. of Neurosurgery

## 2024-03-23 ENCOUNTER — Encounter: Payer: Self-pay | Admitting: Orthopedic Surgery

## 2024-03-23 ENCOUNTER — Ambulatory Visit (INDEPENDENT_AMBULATORY_CARE_PROVIDER_SITE_OTHER): Admitting: Orthopedic Surgery

## 2024-03-23 VITALS — BP 110/76 | Ht 72.0 in | Wt 229.2 lb

## 2024-03-23 DIAGNOSIS — M5416 Radiculopathy, lumbar region: Secondary | ICD-10-CM

## 2024-03-23 DIAGNOSIS — M47816 Spondylosis without myelopathy or radiculopathy, lumbar region: Secondary | ICD-10-CM

## 2024-03-23 DIAGNOSIS — M4306 Spondylolysis, lumbar region: Secondary | ICD-10-CM | POA: Diagnosis not present

## 2024-03-23 NOTE — Patient Instructions (Signed)
 It was so nice to see you today. Thank you so much for coming in.    You have some wear and tear in your back. I think this is causing your back and leg pain.   I want to get lumbar xrays to look into this farther. I need standing AP/lateral/flexion/extension views. I do not want oblique views. If your NP can order this, you will need to get the imaging on a CD and then drop it off or mail it to me. They will also need to send me a copy of the report.   If you mail the CD to me, let me know when you put it in the mail.   Depending on results of your xrays, we will consider physical therapy and/or possible injections in your back.   We can regroup and discuss options after I get your xrays.   Please do not hesitate to call if you have any questions or concerns. You can also message me in MyChart.   Glade Boys PA-C [phone]     The physicians and staff at [facility] at [facility] are committed to providing excellent care. You may receive a survey asking for feedback about your experience at our office. We value you your feedback and appreciate you taking the time to to fill it out. The [facility] leadership team is also available to discuss your experience in person, feel free to contact us  [phone].

## 2024-03-30 ENCOUNTER — Other Ambulatory Visit (HOSPITAL_BASED_OUTPATIENT_CLINIC_OR_DEPARTMENT_OTHER): Payer: Self-pay | Admitting: Family

## 2024-03-30 ENCOUNTER — Ambulatory Visit (INDEPENDENT_AMBULATORY_CARE_PROVIDER_SITE_OTHER)
Admission: RE | Admit: 2024-03-30 | Discharge: 2024-03-30 | Disposition: A | Discharge status: Home / Self Care | Source: Ambulatory Visit | Attending: Family | Admitting: Family

## 2024-03-30 DIAGNOSIS — M545 Low back pain, unspecified: Secondary | ICD-10-CM

## 2024-03-30 DIAGNOSIS — M4306 Spondylolysis, lumbar region: Secondary | ICD-10-CM

## 2024-03-30 DIAGNOSIS — M5416 Radiculopathy, lumbar region: Secondary | ICD-10-CM

## 2024-03-30 DIAGNOSIS — M47816 Spondylosis without myelopathy or radiculopathy, lumbar region: Secondary | ICD-10-CM

## 2024-03-31 ENCOUNTER — Ambulatory Visit

## 2024-04-06 NOTE — Progress Notes (Unsigned)
 Telephone Visit- Progress Note: Referring Physician:  King, Jacqueline E, FNP [id number] [place],  [place] [address]  Primary Physician:  King, Jacqueline E, FNP  This visit was performed via telephone.  Patient location: home Provider location: office  I spent a total of 10 minutes non-face-to-face activities for this visit on the date of this encounter including review of current clinical condition and response to treatment.    Patient has given verbal consent to this telephone visits and we reviewed the limitations of a telephone visit. Patient wishes to proceed.    Chief Complaint:  review lumbar xrays  History of Present Illness: Aaron Reese is a 58 y.o. male has a history of  HTN, CAD, OSA, GERD, depression, hypercholesterolemia, GAD.   Last seen by me on 03/23/24 for LBP with intermittent bilateral leg pain. He has known bilateral pars at L5 with no central or foraminal stenosis. He likely has slip at L5-S1, but not easily seen on lumbar MRI.   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   EMG also showed right mild CTS.   Phone visit scheduled to review his lumbar xrays.   He has constant LBP with intermittent bilateral posterior leg pain to his feet, right > left. He has numbness/tingling in his feet. Pain is worse with prolonged sitting/standing/walking. Some relief with prednisone.    He is on PLAVIX . His PCP gave him prednisone and this helped, but pain is coming back.    Tobacco use: Does not smoke.    Bowel/Bladder Dysfunction: none   Conservative measures:  Physical therapy:  Went to PT 1 time and never went back Multimodal medical therapy including regular antiinflammatories:  Lyrica, Diclofenac Injections: no epidural steroid injections   Past Surgery: none    Exam: No exam done as this was a telephone encounter.     Imaging: Lumbar xrays  dated 03/30/24:  FINDINGS: L5 pars defects noted with grade 1 anterolisthesis of L5 on S1 measuring 11 mm in the neutral position. This has progressed compared to 2007. Preserved vertebral body heights and disc spaces. No acute compression fracture, wedge-shaped deformity or focal kyphosis.   With flexion, there is reduction in the L5 on S1 anterolisthesis measuring 6 mm. With extension, the L5 on S1 anterolisthesis is similar to neutral measuring 10 mm.   SI joints are maintained.  Aorta atherosclerotic.   IMPRESSION: 1. L5 pars defects with grade 1 anterolisthesis of L5 on S1 measuring 11 mm. This has progressed compared to 2007. 2. Reduction in the L5 on S1 anterolisthesis noted with flexion.     Electronically Signed   By: CHRISTELLA.  Shick M.D.   On: 04/02/2024 13:46  I have personally reviewed the images and agree with the above interpretation.  Assessment and Plan: [name] thinks he had injury to back as a child/teenager and it's been hurting intermittent since that time.    He has constant LBP with intermittent bilateral posterior leg pain to his feet, right > left. He has numbness/tingling in his feet.    He has known bilateral pars at L5 with no central or foraminal stenosis. He has 10mm slip at L5 on S1 that reduces on flexion.   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   EMG also showed right mild CTS.  Treatment options discussed with patient and following plan made:   - Discussed that he is a surgery candidate.  - He would like to try PT prior to any surgery discussion. PT orders for lumbar spine sent to [facility] in [place].  - Briefly discussed lumbar injections. He declines for now.  - Will have him f/u with me in 6-8 weeks for recheck. If he decides he wants to discuss surgery, can change appointment to one of the surgeons.   Of note,  previously he stated numbness and tingling in right hand is intermittent and tolerable. Will watch this for now. He previously did not want to see Dr. Claudene to discuss further options for carpal tunnel.   Glade Boys PA-C Neurosurgery

## 2024-04-08 ENCOUNTER — Encounter: Payer: Self-pay | Admitting: Orthopedic Surgery

## 2024-04-08 ENCOUNTER — Ambulatory Visit (INDEPENDENT_AMBULATORY_CARE_PROVIDER_SITE_OTHER): Admitting: Orthopedic Surgery

## 2024-04-08 DIAGNOSIS — M5127 Other intervertebral disc displacement, lumbosacral region: Secondary | ICD-10-CM | POA: Diagnosis not present

## 2024-04-08 DIAGNOSIS — M4726 Other spondylosis with radiculopathy, lumbar region: Secondary | ICD-10-CM | POA: Diagnosis not present

## 2024-04-08 DIAGNOSIS — M4316 Spondylolisthesis, lumbar region: Secondary | ICD-10-CM

## 2024-04-08 DIAGNOSIS — M47816 Spondylosis without myelopathy or radiculopathy, lumbar region: Secondary | ICD-10-CM

## 2024-04-08 DIAGNOSIS — M4306 Spondylolysis, lumbar region: Secondary | ICD-10-CM

## 2024-04-08 DIAGNOSIS — M5416 Radiculopathy, lumbar region: Secondary | ICD-10-CM

## 2024-04-21 NOTE — Progress Notes (Signed)
 I saw Aahan Marques in neurology clinic on 04/29/24 in follow up for numbness and tingling in RUE and bilateral feet, headaches, neck pain, and poor sleep.  HPI: Zacharey Jensen is a 58 y.o. year old male with a history of HTN, HLD, CAD, pre-DM, fibromyalgia, vit D deficiency, anxiety, OSA (on CPAP) who we last saw on 12/27/23.  To briefly review: 12/27/23: Patient first noticed numbness and tingling in his right arm (whole arm) at the beginning of 2025, mostly when using his arm. The left arm is less noticeable. 2-3 months later he noticed tingling, burning in his legs (mostly feet), like they were going to sleep, if he put his legs in difference positions. He thinks both legs are similar (one not worse than the other). The symptoms would come and go. If he tries to sleep on his left side, the entire right arm will have the going to sleep sensation. He denies muscle weakness. Symptoms have been better lately (over the last month), which he describes as bothering him less.    Patient went to PT once for his low back about 3 weeks ago. He did not find it helpful, so he did not return.   Patient takes Lyrica 150 mg BID for fibromyalgia which he has been taking for a while. He was diagnosed with fibromyalgia due to headaches and body aches. He continues to have this. He describes the headaches as left sided head and neck pain mostly. It is pressure and tightness with associated lightheadedness. He has headaches every day. He typically wakes with a headache. It lasts most of the day, though it comes and goes. He denies photophobia. He may have some phonophobia. He is unsure about nausea. He is prescribed amitriptyline and nortriptyline for headaches, but he is only taking amitriptyline, which he started recently. He takes amitriptyline 50 mg at night, but he still does not sleep well. He has OSA and is on CPAP, but does not feel like he rests well at night. He has not seen sleep medicine in years.   He  does not take any supplements.   The patient does not report symptoms referable to autonomic dysfunction including impaired sweating, heat or cold intolerance, excessive mucosal dryness, gastroparetic early satiety, postprandial abdominal bloating, constipation, bowel or bladder dyscontrol, erectile dysfunction, or syncope/presyncope/orthostatic intolerance.   He does not report any constitutional symptoms like fever, night sweats, anorexia or unintentional weight loss.   EtOH use: none  Restrictive diet? no Family history of neurologic disease? No  Most recent Assessment and Plan (12/27/23): Cong Hightower is a 58 y.o. male who presents for evaluation of numbness and tingling in RUE and bilateral feet, headaches, neck pain, and poor sleep. He has a relevant medical history of TN, HLD, CAD, pre-DM, fibromyalgia, vit D deficiency, anxiety, OSA (on CPAP). His neurological examination is pertinent for neck tightness and diminished vibratory sensation in his feet. He has no recent testing/labs for review. His headaches may be OSA related as they occur in the morning and he reports not feeling refreshed after sleep. He has not seen sleep medicine in many years. There is also likely contributions from cervicalgia. In terms of his numbness and tingling, the etiology is less clear. He has neck and back pain, so radiculopathy is possible, but symptoms could also represent a distal symmetric polyneuropathy as well (or both). I will work up as below.   PLAN: -Blood work: B1, B12, HbA1c, IFE -EMG: PN right side -Sleep medicine referral to follow  up on OSA given poor quality of sleep lately -Physical therapy for cervicalgia -Do not take nortriptyline anymore. Okay to continue amitriptyline 50 mg at bedtime for now. -Continue Lyrica 150 mg BID  Since their last visit: B12 was borderline low, so I recommended B12 1000 mcg daily. He is taking this.  EMG on 02/04/24 showed old right L5 radiculopathy, and mild  right carpal tunnel syndrome, but no large fiber neuropathy. I recommended wrist brace for CTS and MRI lumbar spine. MRI lumbar spine showed old fractures at L5 but no new changes or clear stenosis. He was referred to NSGY. He is seeing Glade Boys. He was offered surgery but wanted to do PT first. He has been going to PT for 3 weeks, twice weekly. He thinks it may be helping a little. He is still having a lot of back pain though that makes it difficult to sleep.  He has pain in both legs and they sometimes goes numb (right may be more than left)  He stopped Lyrica. He notices no significant difference off of the Lyrica. He is not taking amitriptyline.  He denies significant headaches of late.  He has not seen sleep medicine. He states he will make this appointment though.   MEDICATIONS:  Outpatient Encounter Medications as of 04/29/2024  Medication Sig   clopidogrel  (PLAVIX ) 75 MG tablet TAKE ONE TABLET BY MOUTH DAILY WITH BREAKFAST   docusate sodium (COLACE) 100 MG capsule Take 100 mg by mouth daily.   escitalopram (LEXAPRO) 10 MG tablet Take 10 mg by mouth daily.   ezetimibe (ZETIA) 10 MG tablet Take 10 mg by mouth daily. Monday / Wednesday / Friday   lisinopril  (PRINIVIL ,ZESTRIL ) 10 MG tablet Take 10 mg by mouth every evening.    metoprolol  succinate (TOPROL -XL) 50 MG 24 hr tablet Take 1 tablet (50 mg total) by mouth daily. Take with or immediately following a meal.   nitroGLYCERIN  (NITROSTAT ) 0.4 MG SL tablet Place 1 tablet (0.4 mg total) under the tongue every 5 (five) minutes as needed.   rosuvastatin (CRESTOR) 40 MG tablet Take 40 mg by mouth daily.   ZEPBOUND 10 MG/0.5ML Pen SMARTSIG:1 unspecified SUB-Q Once a Week   traMADol (ULTRAM) 50 MG tablet Take 150 mg by mouth as needed for pain. (Patient not taking: Reported on 04/29/2024)   Facility-Administered Encounter Medications as of 04/29/2024  Medication   0.9 %  sodium chloride  infusion    PAST MEDICAL HISTORY: Past Medical  History:  Diagnosis Date   Abnormal finding on lung imaging 03/31/2018   October 2019 CT chest  showing normal pulmonary parenchyma in the upper lobes but in the bases there is atelectasis and some airway thickening with surrounding groundglass.  Compared to in April 2018 CT scan of his abdomen this does appear to be progressive.  10/07/2018-chest x-ray ([facility])- chronic appearing lingular opacity, no acute infiltrate edema mass or effusion otherwise, chr   Anxiety 03/24/2018   Benign essential hypertension 03/24/2018   CAD (coronary artery disease)    s/p DES to pLAD & mLAD 04/08/18   Chest pain in adult 03/24/2018   Chlorine gas exposure 10/08/2018   Depression 03/24/2018   Frequent headaches 03/24/2018   GAD (generalized anxiety disorder) 04/05/2018   GERD (gastroesophageal reflux disease) 03/24/2018   Hypercholesterolemia 03/24/2018   Hypotestosteronemia 03/24/2018   OSA (obstructive sleep apnea) 10/08/2018   Split-night sleep study January 2020 AHI 24.3 with optimal control on CPAP 12 cm H2O.    Shortness of breath  10/08/2018   10/08/2018-CT chest without contrast- lingular scarring, no confluent opacities or effusions otherwise  12/10/2018-pulmonary function test- FVC 4.15 (79% predicted), postbronchodilator ratio 84, postbronchodilator FEV1 3.72 (91% predicted), no bronchodilator response, mid flow reversibility, DLCO 94   Sleep apnea    Substernal chest pain 03/24/2018    PAST SURGICAL HISTORY: Past Surgical History:  Procedure Laterality Date   CARDIAC CATHETERIZATION     COLONOSCOPY  11/23/2016   Colon polyps. Diverticulosis of colon. Internal hemorrhoids.    CORONARY STENT INTERVENTION N/A 04/08/2018   Procedure: CORONARY STENT INTERVENTION;  Surgeon: Dann Candyce RAMAN, MD;  Location[facility] INVASIVE CV LAB;  Service: Cardiovascular;  Laterality: N/A;   LEFT HEART CATH AND CORONARY ANGIOGRAPHY N/A 04/08/2018   Procedure: LEFT HEART CATH AND CORONARY ANGIOGRAPHY;  Surgeon:  Dann Candyce RAMAN, MD;  Location: [facility] INVASIVE CV LAB;  Service: Cardiovascular;  Laterality: N/A;   UPPER GASTROINTESTINAL ENDOSCOPY      ALLERGIES: Allergies  Allergen Reactions   Citalopram Other (See Comments)    ineffective   Sertraline Other (See Comments)    ineffective   Venlafaxine Other (See Comments)    limited efficacy    FAMILY HISTORY: Family History  Problem Relation Age of Onset   Hypertension Mother    ALS Father    Stroke Brother    Heart disease Neg Hx    Cancer Neg Hx    Diabetes Neg Hx    Colon cancer Neg Hx    Esophageal cancer Neg Hx    Stomach cancer Neg Hx    Rectal cancer Neg Hx     SOCIAL HISTORY: Social History   Tobacco Use   Smoking status: Never   Smokeless tobacco: Never  Vaping Use   Vaping status: Never Used  Substance Use Topics   Alcohol use: Not Currently   Drug use: Not Currently   Social History   Social History Narrative   Are you right handed or left handed? Right   Are you currently employed ?    What is your current occupation? [employer] [place]   Do you live at home alone?   Who lives with you? Son   What type of home do you live in: 1 story or 2 story? one    Caffiene 1 glass    Objective:  Vital Signs:  BP 108/72   Pulse 79   Ht 6' (1.829 m)   Wt 226 lb (102.5 kg)   SpO2 96%   BMI 30.65 kg/m   General: General appearance: Awake and alert. No distress. Cooperative with exam.  Skin: No obvious rash or jaundice. HEENT: Atraumatic. Anicteric. Lungs: Non-labored breathing on room air  Heart: Regular Extremities: No edema. No obvious deformity.  Musculoskeletal: No obvious joint swelling.  Neurological: Mental Status: Alert. Speech fluent. No pseudobulbar affect Cranial Nerves: CNII: No RAPD. Visual fields intact. CNIII, IV, VI: PERRL. No nystagmus. EOMI. CN V: Facial sensation intact bilaterally to fine touch. CN VII: Facial muscles symmetric and strong. No ptosis at rest. CN VIII: Hears  finger rub well bilaterally. CN IX: No hypophonia. CN X: Palate elevates symmetrically. CN XI: Full strength shoulder shrug bilaterally. CN XII: Tongue protrusion full and midline. No atrophy or fasciculations. No significant dysarthria Motor: Tone is normal. Strength is 5/5 in bilateral upper and lower extremities. Reflexes:  Right Left  Bicep 2+ 2+  Tricep 2+ 2+  BrRad 2+ 2+  Knee 2+ 2+  Ankle 1+ 1+   Sensation: Pinprick: Intact in  all extremities Vibration: Absent in left great toe and ankle, otherwise intact in all extremities Coordination: Intact finger-to- nose-finger bilaterally Gait: Able to rise from chair with arms crossed unassisted. Normal, narrow-based gait. Able to walk on toes and heels.   Lab and Test Review: New results: 12/27/23: B1 wnl HbA1c: 5.4 IFE: no M protein B12: 245  EMG (02/04/24): NCV & EMG Findings: Extensive electrodiagnostic evaluation of the right upper and lower limbs with additional nerve conduction studies of the left upper and lower limbs shows: Right median sensory response shows prolonged distal peak latency (3.7 ms). Right sural, right superficial peroneal/fibular, right ulnar, right radial, left median, and left median-ulnar palmar sensory responses are within normal limits. Right peroneal/fibular (EDB) motor response shows reduced amplitude (1.27 mV). Right tibial (AH), right peroneal/fibular (TA), right median (APB), right ulnar (ADM), and left peroneal/fibular (EDB) motor responses are within normal limits. Right H reflex latency is within normal limits. Chronic motor axon loss changes without accompanying active denervation changes are seen in the right tibialis anterior, right flexor digitorum longus, right gluteus medius, and right lumbosacral paraspinal (L5 level) muscles.    Impression: This is an abnormal study. The findings are most consistent with the following: The residuals of an old intraspinal canal lesion (ie: motor  radiculopathy) at the right L5 root or segment, mild in degree electrically. Right median mononeuropathy at or distal to the wrist, consistent with carpal tunnel syndrome, mild in degree electrically. No electrodiagnostic evidence of a large fiber sensorimotor neuropathy. No electrodiagnostic evidence of a right cervical (C5-C8) motor radiculopathy. No electrodiagnostic evidence of a left median mononeuropathy at or distal to the wrist (ie: carpal tunnel syndrome).  MRI lumbar spine wo contrast (02/20/24): IMPRESSION: 1. 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 Mild degenerative disc disease with mild disc bulge at L3-4 3. Otherwise normal  Lumbar spine xray with flexion and extension (03/30/24): FINDINGS: L5 pars defects noted with grade 1 anterolisthesis of L5 on S1 measuring 11 mm in the neutral position. This has progressed compared to 2007. Preserved vertebral body heights and disc spaces. No acute compression fracture, wedge-shaped deformity or focal kyphosis.   With flexion, there is reduction in the L5 on S1 anterolisthesis measuring 6 mm. With extension, the L5 on S1 anterolisthesis is similar to neutral measuring 10 mm.   SI joints are maintained.  Aorta atherosclerotic.   IMPRESSION: 1. L5 pars defects with grade 1 anterolisthesis of L5 on S1 measuring 11 mm. This has progressed compared to 2007. 2. Reduction in the L5 on S1 anterolisthesis noted with flexion.  Previously reviewed results: None  ASSESSMENT: This is Rande Roylance, a 58 y.o. male with pain in lower back radiating into legs with numbness. He also has numbness and tingling in right arm. EMG on 02/04/24 showed very mild right CTS and old right L5 radiculopathy. MRI lumbar spine showed chronic bilateral par interarticularis fractures without clear stenosis. Patient has seen NSGY who offered surgery, but he preferred to try PT first. This is offering little benefit and  his pain is increasing in the back. He did not find Lyrica or TCA helpful for pain.   Plan: -Discussed Lyrica. Patient did not get benefit and has not noticed a difference since stopping it, but if he wants to retry, that is okay. -Follow up with NSGY as planned -Continue PT -Continue wrist brace for right CTS  Return to clinic as needed  Total time spent reviewing records, interview, history/exam, documentation, and coordination  of care on day of encounter:  35 min  Venetia Potters, MD

## 2024-04-29 ENCOUNTER — Ambulatory Visit: Admitting: Neurology

## 2024-04-29 ENCOUNTER — Encounter: Payer: Self-pay | Admitting: Neurology

## 2024-04-29 VITALS — BP 108/72 | HR 79 | Ht 72.0 in | Wt 226.0 lb

## 2024-04-29 DIAGNOSIS — M5417 Radiculopathy, lumbosacral region: Secondary | ICD-10-CM

## 2024-04-29 DIAGNOSIS — G4486 Cervicogenic headache: Secondary | ICD-10-CM

## 2024-04-29 DIAGNOSIS — G4733 Obstructive sleep apnea (adult) (pediatric): Secondary | ICD-10-CM | POA: Diagnosis not present

## 2024-04-29 DIAGNOSIS — G5601 Carpal tunnel syndrome, right upper limb: Secondary | ICD-10-CM

## 2024-04-29 NOTE — Patient Instructions (Signed)
 You could retry taking Lyrica for pain, but given you don't think it was doing anything, I'm not sure it would help this time either.  I would give consideration to recommendations from neurosurgery and discuss your questions about surgery with them further, asking about alternatives if there are any.  Continue physical therapy.  Follow up with me as needed.  The physicians and staff at [facility] are committed to providing excellent care. You may receive a survey requesting feedback about your experience at our office. We strive to receive very good responses to the survey questions. If you feel that your experience would prevent you from giving the office a very good  response, please contact our office to try to remedy the situation. We may be reached at [phone]. Thank you for taking the time out of your busy day to complete the survey.  Venetia Potters, MD [facility]

## 2024-05-29 NOTE — Progress Notes (Unsigned)
 Referring Physician:  King, Jacqueline E, FNP [address] [place],  [place] [address]  Primary Physician:  King, Jacqueline E, FNP  History of Present Illness: 06/03/2024 Aaron Reese has a history of HTN, CAD, OSA, GERD, depression, hypercholesterolemia, GAD, cardiac stents.   Last did phone visit with me on 04/08/24. He has known bilateral pars at L5 with no central or foraminal stenosis. He has 10mm slip at L5 on S1 that reduces on flexion.    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    EMG also showed right mild CTS.   We discussed possible surgery and he wanted to try PT first. He declined lumbar injections.   He is here for follow up.   He went to PT from 04/14/24 to 04/29/24 and did 5 total visits. He does not think it helped.    He has constant LBP with intermittent bilateral anterior leg pain to his feet, right > left. He has numbness/tingling in his feet. Pain is worse with prolonged sitting/standing/walking. He also has pain when laying down at night.    He is on PLAVIX .    Tobacco use: Does not smoke.    Bowel/Bladder Dysfunction: none   Conservative measures:  Physical therapy:  [facility] in [place] initial evaluation on 04/14/24 Multimodal medical therapy including regular antiinflammatories:  Lyrica, Diclofenac Injections: no epidural steroid injections   Past Surgery: none   Review of Systems:  A 10 point review of systems is negative, except for the pertinent positives and negatives detailed in the HPI.  Past Medical History: Past Medical History:  Diagnosis Date   Abnormal finding on lung imaging 03/31/2018   October 2019 CT chest  showing normal pulmonary parenchyma in the upper lobes but in the bases there is atelectasis and some airway thickening with surrounding groundglass.  Compared to in April 2018 CT scan of his abdomen this does  appear to be progressive.  10/07/2018-chest x-ray ([facility])- chronic appearing lingular opacity, no acute infiltrate edema mass or effusion otherwise, chr   Anxiety 03/24/2018   Benign essential hypertension 03/24/2018   CAD (coronary artery disease)    s/p DES to pLAD & mLAD 04/08/18   Chest pain in adult 03/24/2018   Chlorine gas exposure 10/08/2018   Depression 03/24/2018   Frequent headaches 03/24/2018   GAD (generalized anxiety disorder) 04/05/2018   GERD (gastroesophageal reflux disease) 03/24/2018   Hypercholesterolemia 03/24/2018   Hypotestosteronemia 03/24/2018   OSA (obstructive sleep apnea) 10/08/2018   Split-night sleep study January 2020 AHI 24.3 with optimal control on CPAP 12 cm H2O.    Shortness of breath 10/08/2018   10/08/2018-CT chest without contrast- lingular scarring, no confluent opacities or effusions otherwise  12/10/2018-pulmonary function test- FVC 4.15 (79% predicted), postbronchodilator ratio 84, postbronchodilator FEV1 3.72 (91% predicted), no bronchodilator response, mid flow reversibility, DLCO 94   Sleep apnea    Substernal chest pain 03/24/2018    Past Surgical History: Past Surgical History:  Procedure Laterality Date   CARDIAC CATHETERIZATION     COLONOSCOPY  11/23/2016   Colon polyps. Diverticulosis of colon. Internal hemorrhoids.    CORONARY STENT INTERVENTION N/A 04/08/2018   Procedure: CORONARY STENT INTERVENTION;  Surgeon: Dann Candyce RAMAN, MD;  Location: [facility] INVASIVE CV LAB;  Service: Cardiovascular;  Laterality: N/A;   LEFT HEART CATH AND CORONARY ANGIOGRAPHY N/A 04/08/2018   Procedure: LEFT HEART CATH AND  CORONARY ANGIOGRAPHY;  Surgeon: Dann Candyce RAMAN, MD;  Location: [facility] INVASIVE CV LAB;  Service: Cardiovascular;  Laterality: N/A;   UPPER GASTROINTESTINAL ENDOSCOPY      Allergies: Allergies as of 06/03/2024 - Review Complete 06/03/2024  Allergen Reaction Noted   Citalopram Other (See Comments) 03/24/2018   Sertraline Other (See  Comments) 03/24/2018   Venlafaxine Other (See Comments) 03/24/2018    Medications: Outpatient Encounter Medications as of 06/03/2024  Medication Sig   clopidogrel  (PLAVIX ) 75 MG tablet TAKE ONE TABLET BY MOUTH DAILY WITH BREAKFAST   docusate sodium (COLACE) 100 MG capsule Take 100 mg by mouth daily.   escitalopram (LEXAPRO) 10 MG tablet Take 10 mg by mouth daily.   ezetimibe (ZETIA) 10 MG tablet Take 10 mg by mouth daily. Monday / Wednesday / Friday   lisinopril  (PRINIVIL ,ZESTRIL ) 10 MG tablet Take 10 mg by mouth every evening.    metoprolol  succinate (TOPROL -XL) 50 MG 24 hr tablet Take 1 tablet (50 mg total) by mouth daily. Take with or immediately following a meal.   nitroGLYCERIN  (NITROSTAT ) 0.4 MG SL tablet Place 1 tablet (0.4 mg total) under the tongue every 5 (five) minutes as needed.   rosuvastatin (CRESTOR) 40 MG tablet Take 40 mg by mouth daily.   ZEPBOUND 10 MG/0.5ML Pen SMARTSIG:1 unspecified SUB-Q Once a Week   traMADol (ULTRAM) 50 MG tablet Take 150 mg by mouth as needed for pain. (Patient not taking: Reported on 06/03/2024)   Facility-Administered Encounter Medications as of 06/03/2024  Medication   0.9 %  sodium chloride  infusion    Social History: Social History   Tobacco Use   Smoking status: Never   Smokeless tobacco: Never  Vaping Use   Vaping status: Never Used  Substance Use Topics   Alcohol use: Not Currently   Drug use: Not Currently    Family Medical History: Family History  Problem Relation Age of Onset   Hypertension Mother    ALS Father    Stroke Brother    Heart disease Neg Hx    Cancer Neg Hx    Diabetes Neg Hx    Colon cancer Neg Hx    Esophageal cancer Neg Hx    Stomach cancer Neg Hx    Rectal cancer Neg Hx     Physical Examination: Vitals:   06/03/24 1510  BP: 128/80      Awake, alert, oriented to person, place, and time.  Speech is clear and fluent. Fund of knowledge is appropriate.   Cranial Nerves: Pupils equal round and  reactive to light.  Facial tone is symmetric.    No posterior lumbar tenderness.   No abnormal lesions on exposed skin.   Strength: Side Iliopsoas Quads Hamstring PF DF EHL  R 5 5 5 5 5 5   L 5 5 5 5 5 5    Reflexes are 2+ and symmetric at the patella and achilles.    Clonus is not present.   Bilateral lower extremity sensation is intact to light touch.     No pain with IR/ER of both hips.   Gait is normal.    Medical Decision Making  Imaging: None  Assessment and Plan: [name] thinks he had injury to back as a child/teenager and it's been hurting intermittent since that time.    He has constant LBP with intermittent bilateral anterior leg pain to his feet, right > left. He has numbness/tingling in his feet.    He has known bilateral pars at L5 with no central  or foraminal stenosis. He has 10mm slip at L5 on S1 that reduces on flexion.    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    EMG also showed right mild CTS.    Treatment options discussed with patient and following plan made:   - No relief with PT. Will call them to clarify if he was discharged.  - He is not interested in lumbar injections.  - He is scared of surgery, but wants to know his options. He will f/u with Dr. Clois to discuss possible surgery options.  - He sees cardiology [facility]) and is on PLAVIX . He will schedule follow up with them and let them know he is considering surgery.   I spent a total of 20 minutes in face-to-face and non-face-to-face activities related to this patient's care today including review of outside records, review of imaging, review of symptoms, physical exam, discussion of differential diagnosis, discussion of treatment options, and documentation.   Glade Boys PA-C Dept. of Neurosurgery

## 2024-06-03 ENCOUNTER — Encounter: Payer: Self-pay | Admitting: Orthopedic Surgery

## 2024-06-03 ENCOUNTER — Ambulatory Visit: Admitting: Orthopedic Surgery

## 2024-06-03 VITALS — BP 128/80 | Ht 72.0 in | Wt 226.0 lb

## 2024-06-03 DIAGNOSIS — M4306 Spondylolysis, lumbar region: Secondary | ICD-10-CM

## 2024-06-03 DIAGNOSIS — G5601 Carpal tunnel syndrome, right upper limb: Secondary | ICD-10-CM | POA: Diagnosis not present

## 2024-06-03 DIAGNOSIS — M5416 Radiculopathy, lumbar region: Secondary | ICD-10-CM

## 2024-06-03 DIAGNOSIS — M4316 Spondylolisthesis, lumbar region: Secondary | ICD-10-CM

## 2024-06-03 DIAGNOSIS — M47816 Spondylosis without myelopathy or radiculopathy, lumbar region: Secondary | ICD-10-CM

## 2024-06-03 DIAGNOSIS — M4726 Other spondylosis with radiculopathy, lumbar region: Secondary | ICD-10-CM

## 2024-06-03 DIAGNOSIS — M5127 Other intervertebral disc displacement, lumbosacral region: Secondary | ICD-10-CM

## 2024-06-22 NOTE — Progress Notes (Signed)
 "   Referring Physician:  King, Jacqueline E, FNP [address] [place],  [place] [address]  Primary Physician:  King, Jacqueline E, FNP  History of Present Illness: 06/30/2024 Aaron Reese is here today with a chief complaint of chronic, progressive low back pain.  He has had low back pain for several years with significant worsening over the past year. Pain is mainly in the lumbar region with occasional radiation to the buttocks and intermittent leg paresthesias. He denies significant leg pain, numbness, weakness, or gait difficulty.  Pain is worsened by sitting, lying down, prolonged standing, and walking, and is relieved by reclining in a chair. Intensity fluctuates but is overall progressive. It now disrupts sleep, daily activities, and hunting, and he requires more rest. He occasionally feels his back catch.  He completed physical therapy and tried medications without lasting relief. He has not had spine surgery. Serial imaging, including x-rays and MRI, show progression of L5-S1 changes, with the most recent study 2 months ago demonstrating a forward slip of about 10-11 mm compared with imaging 18 years earlier.  He works as an international aid/development worker at a [employer] with mostly sedentary duties but some physical tasks. He can perform most work duties but pain increasingly limits work and recreational activities. He does not use tobacco or nicotine.   Discussed the use of AI scribe software for clinical note transcription with the patient, who gave verbal consent to proceed.   No relief with PT(5 visits), No ESIs.   [name] has no symptoms of cervical myelopathy.  The symptoms are causing a significant impact on the patient's life.   I have utilized the care everywhere function in epic to review the outside records available from external health systems.  Progress Note from Glade Boys, [place] on 06/03/24:   History of Present Illness: 06/03/2024 Aaron Reese has a  history of HTN, CAD, OSA, GERD, depression, hypercholesterolemia, GAD, cardiac stents.    Last did phone visit with me on 04/08/24. He has known bilateral pars at L5 with no central or foraminal stenosis. He has 10mm slip at L5 on S1 that reduces on flexion.    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    EMG also showed right mild CTS.    We discussed possible surgery and he wanted to try PT first. He declined lumbar injections.    He is here for follow up.    He went to PT from 04/14/24 to 04/29/24 and did 5 total visits. He does not think it helped.    He has constant LBP with intermittent bilateral anterior leg pain to his feet, right > left. He has numbness/tingling in his feet. Pain is worse with prolonged sitting/standing/walking. He also has pain when laying down at night.    He is on PLAVIX .    Tobacco use: Does not smoke.    Bowel/Bladder Dysfunction: none   Conservative measures:  Physical therapy:  [facility] in [place] initial evaluation on 04/14/24 Multimodal medical therapy including regular antiinflammatories:  Lyrica, Diclofenac Injections: no epidural steroid injections   Past Surgery: none   Review of Systems:  A 10 point review of systems is negative, except for the pertinent positives and negatives detailed in the HPI.  Past Medical History: Past Medical History:  Diagnosis Date   Abnormal finding on lung imaging 03/31/2018   October 2019 CT  chest  showing normal pulmonary parenchyma in the upper lobes but in the bases there is atelectasis and some airway thickening with surrounding groundglass.  Compared to in April 2018 CT scan of his abdomen this does appear to be progressive.  10/07/2018-chest x-ray ([facility])- chronic appearing lingular opacity, no acute infiltrate edema mass or effusion otherwise, chr   Anxiety 03/24/2018    Benign essential hypertension 03/24/2018   CAD (coronary artery disease)    s/p DES to pLAD & mLAD 04/08/18   Chest pain in adult 03/24/2018   Chlorine gas exposure 10/08/2018   Depression 03/24/2018   Frequent headaches 03/24/2018   GAD (generalized anxiety disorder) 04/05/2018   GERD (gastroesophageal reflux disease) 03/24/2018   Hypercholesterolemia 03/24/2018   Hypotestosteronemia 03/24/2018   OSA (obstructive sleep apnea) 10/08/2018   Split-night sleep study January 2020 AHI 24.3 with optimal control on CPAP 12 cm H2O.    Shortness of breath 10/08/2018   10/08/2018-CT chest without contrast- lingular scarring, no confluent opacities or effusions otherwise  12/10/2018-pulmonary function test- FVC 4.15 (79% predicted), postbronchodilator ratio 84, postbronchodilator FEV1 3.72 (91% predicted), no bronchodilator response, mid flow reversibility, DLCO 94   Sleep apnea    Substernal chest pain 03/24/2018    Past Surgical History: Past Surgical History:  Procedure Laterality Date   CARDIAC CATHETERIZATION     COLONOSCOPY  11/23/2016   Colon polyps. Diverticulosis of colon. Internal hemorrhoids.    CORONARY STENT INTERVENTION N/A 04/08/2018   Procedure: CORONARY STENT INTERVENTION;  Surgeon: Dann Candyce RAMAN, MD;  Location: [facility] INVASIVE CV LAB;  Service: Cardiovascular;  Laterality: N/A;   LEFT HEART CATH AND CORONARY ANGIOGRAPHY N/A 04/08/2018   Procedure: LEFT HEART CATH AND CORONARY ANGIOGRAPHY;  Surgeon: Dann Candyce RAMAN, MD;  Location: [facility] INVASIVE CV LAB;  Service: Cardiovascular;  Laterality: N/A;   UPPER GASTROINTESTINAL ENDOSCOPY      Allergies: Allergies as of 06/30/2024 - Review Complete 06/30/2024  Allergen Reaction Noted   Citalopram Other (See Comments) 03/24/2018   Sertraline Other (See Comments) 03/24/2018   Venlafaxine Other (See Comments) 03/24/2018    Medications: Current Medications[1]  Social History: Social History[2]  Family Medical History: Family History   Problem Relation Age of Onset   Hypertension Mother    ALS Father    Stroke Brother    Heart disease Neg Hx    Cancer Neg Hx    Diabetes Neg Hx    Colon cancer Neg Hx    Esophageal cancer Neg Hx    Stomach cancer Neg Hx    Rectal cancer Neg Hx     Physical Examination: Vitals:   06/30/24 1507  BP: 130/78    General: Patient is in no apparent distress. Attention to examination is appropriate.  Neck:   Supple.  Full range of motion.  Respiratory: Patient is breathing without any difficulty.   NEUROLOGICAL:     Awake, alert, oriented to person, place, and time.  Speech is clear and fluent.   Cranial Nerves: Pupils equal round and reactive to light.  Facial tone is symmetric.  Facial sensation is symmetric. Shoulder shrug is symmetric. Tongue protrusion is midline.  There is no pronator drift.  Strength: Side Biceps Triceps Deltoid Interossei Grip Wrist Ext. Wrist Flex.  R 5 5 5 5 5 5 5   L 5 5 5 5 5 5 5    Side Iliopsoas Quads Hamstring PF DF EHL  R 5 5 5 5 5 5   L 5 5 5 5 5  5  Reflexes are 1+ and symmetric at the biceps, triceps, brachioradialis, patella and achilles.   Hoffman's is absent.   Bilateral upper and lower extremity sensation is intact to light touch.    No evidence of dysmetria noted.  Gait is antalgic.     Medical Decision Making  Imaging: MRI L spine 02/20/2024 Paraspinal tissues: No significant abnormality   L1-L2: Normal   L2-L3: Normal   L3-L4: Mild degenerative disc disease with mild disc bulge. No significant facet disease   L4-L5: Normal   L5-S1: There are chronic bilateral pars interarticularis fractures without spondylolisthesis. No spinal stenosis or foraminal stenosis   IMPRESSION: 1. 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 Mild degenerative disc disease with mild disc bulge at L3-4 3. Otherwise normal     Electronically Signed   By: Nancyann Burns M.D.   On: 02/24/2024 10:24     Flex Ext radiographs L spine 03/30/2024 IMPRESSION: 1. L5 pars defects with grade 1 anterolisthesis of L5 on S1 measuring 11 mm. This has progressed compared to 2007. 2. Reduction in the L5 on S1 anterolisthesis noted with flexion.     Electronically Signed   By: CHRISTELLA.  Shick M.D.   On: 04/02/2024 13:46  I have personally reviewed the images and agree with the above interpretation.  Assessment and Plan: Aaron Reese is a pleasant 59 y.o. male with a pars defect at L5 that has worsened over time.  He previously had minimal spondylolisthesis which has now increased to 11 mm.  When he lays down for his MRI scan, the spondylolisthesis disappears.  As such, he has overt and frank lumbar instability.  He has back pain secondary to this.  His pain has worsened over time.  His radiographic findings have worsened over time.  He has tried and failed conservative management.  At this point, no further conservative management is indicated.  I recommended L5-S1 anterior lumbar interbody fusion with posterior spinal fusion with instrumentation.  I discussed the planned procedure at length with the patient, including the risks, benefits, alternatives, and indications. The risks discussed include but are not limited to bleeding, infection, need for reoperation, spinal fluid leak, stroke, vision loss, anesthetic complication, coma, paralysis, and even death. I also described in detail that improvement was not guaranteed.  The patient expressed understanding of these risks, and asked that we proceed with surgery. I described the surgery in layman's terms, and gave ample opportunity for questions, which were answered to the best of my ability.   I spent a total of 30 minutes in this patient's care today. This time was spent reviewing pertinent records including imaging studies, obtaining and confirming history, performing a directed evaluation, formulating and discussing my recommendations, and documenting the  visit within the medical record.       Thank you for involving me in the care of this patient.      Aaron Reese K. Clois MD, [facility] Neurosurgery     [1]  Current Outpatient Medications:    clopidogrel  (PLAVIX ) 75 MG tablet, TAKE ONE TABLET BY MOUTH DAILY WITH BREAKFAST, Disp: 30 tablet, Rfl: 11   docusate sodium (COLACE) 100 MG capsule, Take 100 mg by mouth daily., Disp: , Rfl:    escitalopram (LEXAPRO) 10 MG tablet, Take 10 mg by mouth daily., Disp: , Rfl:    ezetimibe (ZETIA) 10 MG tablet, Take 10 mg by mouth daily. Monday / Wednesday / Friday, Disp: , Rfl:    lisinopril  (PRINIVIL ,ZESTRIL ) 10 MG tablet, Take 10  mg by mouth every evening. , Disp: , Rfl: 1   metoprolol  succinate (TOPROL -XL) 50 MG 24 hr tablet, Take 1 tablet (50 mg total) by mouth daily. Take with or immediately following a meal., Disp: 30 tablet, Rfl: 3   nitroGLYCERIN  (NITROSTAT ) 0.4 MG SL tablet, Place 1 tablet (0.4 mg total) under the tongue every 5 (five) minutes as needed., Disp: 25 tablet, Rfl: 2   rosuvastatin (CRESTOR) 40 MG tablet, Take 40 mg by mouth daily., Disp: , Rfl:    traMADol (ULTRAM) 50 MG tablet, Take 150 mg by mouth as needed for pain., Disp: , Rfl:    ZEPBOUND 7.5 MG/0.5ML Pen, Inject 7.5 mg into the skin once a week., Disp: , Rfl:   Current Facility-Administered Medications:    0.9 %  sodium chloride  infusion, 500 mL, Intravenous, Once, Charlanne Groom, MD [2]  Social History Tobacco Use   Smoking status: Never   Smokeless tobacco: Never  Vaping Use   Vaping status: Never Used  Substance Use Topics   Alcohol use: Not Currently   Drug use: Not Currently   "

## 2024-06-30 ENCOUNTER — Encounter: Payer: Self-pay | Admitting: Neurosurgery

## 2024-06-30 ENCOUNTER — Other Ambulatory Visit: Payer: Self-pay

## 2024-06-30 ENCOUNTER — Ambulatory Visit: Admitting: Neurosurgery

## 2024-06-30 VITALS — BP 130/78 | Ht 72.0 in | Wt 220.4 lb

## 2024-06-30 DIAGNOSIS — M4316 Spondylolisthesis, lumbar region: Secondary | ICD-10-CM | POA: Diagnosis not present

## 2024-06-30 DIAGNOSIS — M532X6 Spinal instabilities, lumbar region: Secondary | ICD-10-CM

## 2024-06-30 DIAGNOSIS — G8929 Other chronic pain: Secondary | ICD-10-CM

## 2024-06-30 DIAGNOSIS — Z01818 Encounter for other preprocedural examination: Secondary | ICD-10-CM

## 2024-06-30 DIAGNOSIS — M4310 Spondylolisthesis, site unspecified: Secondary | ICD-10-CM

## 2024-06-30 NOTE — Patient Instructions (Addendum)
 Please see below for information in regards to your upcoming surgery:   Planned surgery: L5-S1 Anterior Lumbar Interbody Fusion, Posterior Spinal Fusion   Surgery date: 09/02/24 at [facility] (Medical Mall: [address] [place], [place] [address]) - you will find out your arrival time the business day before your surgery.   Pre-op appointment at [facility] Pre-admit Testing: you will receive a call with a date/time for this appointment. If you are scheduled for an in person appointment, Pre-admit Testing is located on the first floor of the [facility] building, [address], [address]. During this appointment, they will advise you which medications you can take the morning of surgery, and which medications you will need to hold for surgery. Labs (such as blood work, EKG) may be done at your pre-op appointment. You are not required to fast for these labs. Should you need to change your pre-op appointment, please call Pre-admit testing at [phone].   Please bring your medication bottles or an up to date medication list to your pre-admit testing appointment (regardless of whether we have a list in your chart).    Blood thinners:   Plavix  (clopidogrel ): Stop Plavix  5 days prior, resume Plavix  14 days after   Aspirin  325mg : OK to take aspirin  325mg  in place of plavix  if needed     Diabetes/heart failure/kidney disease/weight loss medications that require an extended hold: Per anesthesia guidelines (due to the increased risk of aspiration caused by delayed gastric emptying):  Tirzepatide (Zepbound) injection - hold 7 days prior to surgery   Surgical clearance: we will send a clearance form to Arlyne Novak, FNP & Dr. Redell Leiter. They may wish to see you in their office prior to signing the clearance form. If so, they may call you to schedule an appointment.    NSAIDS (Non-steroidal anti-inflammatory drugs): because you are having a  fusion, please avoid taking any NSAIDS (examples: ibuprofen, motrin, aleve, naproxen, meloxicam, diclofenac) for 3 months after surgery. Celebrex  is an exception and is OK to take, if prescribed. Tylenol  is not an NSAID.    Common restrictions after spine surgery: No bending, lifting, or twisting (BLT). Avoid lifting objects heavier than 10 pounds for the first 6 weeks after surgery. Where possible, avoid household activities that involve lifting, bending, reaching, pushing, or pulling such as laundry, vacuuming, grocery shopping, and childcare. Try to arrange for help from friends and family for these activities while you heal. Do not drive while taking prescription pain medication. Weeks 6 through 12 after surgery: avoid lifting more than 25 pounds.    X-rays after surgery: Because you are having a fusion or arthroplasty: for appointments after your 2 week follow-up: please arrive our office 30 minutes prior to your appointment for x-rays. This applies to every appointment after your 2 week follow-up. Failure to do so may result in your appointment being rescheduled.    How to contact us :  If you have any questions/concerns before or after surgery, you can reach us  at [phone], or you can send a mychart message. We can be reached by phone or mychart 8am-4pm, Monday-Friday.  *Please note: Calls after 4pm are forwarded to a third party answering service. Mychart messages are not routinely monitored during evenings, weekends, and holidays. Please call our office to contact the answering service for urgent concerns during non-business hours.    If you have FMLA/disability paperwork, please drop it off or fax it to [phone]   Appointments/FMLA & disability paperwork:  Reche Hait, & Nichole Registered Nurse/Surgery scheduler: Kendelyn, RN & Katie, RN Certified Medical Assistants: Don, CMA, Elenor, CMA, Damien, CMA, & Auston, [place] Physician Assistants: Lyle Decamp, PA-C, Edsel Goods, PA-C & Glade Boys, PA-C Surgeons: Penne Sharps, MD & Reeves Daisy, MD    [facility] PREADMIT TESTING VISIT and SURGERY INFORMATION SHEET   Now that surgery has been scheduled you can anticipate several phone calls from [facility] services. A pharmacy technician will call you to verify your current list of medications taken at home.               The Pre-Service Center will call to verify your insurance information and to give you billing estimates and information.             The Preadmit Testing Office will be calling to schedule a visit to obtain information for the anesthesia team and provide instructions on preparation for surgery.  What can you expect for the Preadmit Testing Visit: Appointments may be scheduled in-person or by telephone.  If a telephone visit is scheduled, you may be asked to come into the office to have lab tests or other studies performed.   This visit will not be completed any greater than 14 days prior to your surgery.  If your surgery has been scheduled for a future date, please do not be alarmed if we have not contacted you to schedule an appointment more than a month prior to the surgery date.    Please be prepared to provide the following information during this appointment:            -Personal medical history                                               -Medication and allergy list            -Any history of problems with anesthesia              -Recent lab work or diagnostic studies            -Please notify us  of any needs we should be aware of to provide the best care possible           -You will be provided with instructions on how to prepare for your surgery.    On The Day of Surgery:  You must have a driver to take you home after surgery, you will be asked not to drive for 24 hours following surgery.  Taxi, Gisele and non-medical transport will not be acceptable means of transportation unless you have a responsible  individual who will be traveling with you.  Visitors in the surgical area:   2 people will be able to visit you in your room once your preparation for surgery has been completed. During surgery, your visitors will be asked to wait in the Surgery Waiting Area.  It is not a requirement for them to stay, if they prefer to leave and come back.  Your visitor(s) will be given an update once the surgery has been completed.  No visitors are allowed in the initial recovery room to respect patient privacy and safety.  Once you are more awake and transfer to the secondary recovery area, or are transferred to an inpatient room, visitors will again be able to see you.  To respect and  protect your privacy: We will ask on the day of surgery who your driver will be and what the contact number for that individual will be. We will ask if it is okay to share information with this individual, or if there is an alternative individual that we, or the surgeon, should contact to provide updates and information. If family or friends come to the surgical information desk requesting information about you, who you have not listed with us , no information will be given.   It may be helpful to designate someone as the main contact who will be responsible for updating your other friends and family.    PREADMIT TESTING OFFICE: [phone] SAME DAY SURGERY: [phone] We look forward to caring for you before and throughout the process of your surgery.

## 2024-06-30 NOTE — Addendum Note (Signed)
 Addended by: Jori Thrall E on: 06/30/2024 04:09 PM   Modules accepted: Orders

## 2024-07-01 ENCOUNTER — Telehealth: Payer: Self-pay

## 2024-07-01 NOTE — Telephone Encounter (Signed)
" ° °  Name: Aaron Reese  DOB: 03/13/1966  MRN: [id number]  Primary Cardiologist: Redell Leiter, MD  Chart reviewed as part of pre-operative protocol coverage. Because of [name]'s past medical history and time since last visit, he will require a follow-up in-office visit in order to better assess preoperative cardiovascular risk.  Pre-op covering staff: - Please schedule appointment and call patient to inform them. If patient already had an upcoming appointment within acceptable timeframe, please add pre-op clearance to the appointment notes so provider is aware. - Please contact requesting surgeon's office via preferred method (i.e, phone, fax) to inform them of need for appointment prior to surgery.  He may hold Plavix  for 5 days prior to the procedure and restart as soon as possible after the procedure. Aspirin  81 mg daily would be sufficient for him to take throughout the perioperative period.  [name], NP  07/01/2024, 8:25 AM   "

## 2024-07-01 NOTE — Telephone Encounter (Signed)
"  ° °  Pre-operative Risk Assessment    Patient Name: Aaron Reese  DOB: 1966-02-06 MRN: [id number]   Date of last office visit: 07/15/2023 Date of next office visit: N/A   Request for Surgical Clearance    Procedure:  L5-S1 Anterior lumbar interbody fusion, posterior spinal fusion  Date of Surgery:  Clearance 09/02/24                                Surgeon:  Dr. Reeves Daisy Surgeon's Group or Practice Name:  [facility] Phone number:  [phone] Fax number:  [phone]   Type of Clearance Requested:   - Pharmacy:  Hold Clopidogrel  (Plavix ) 5 days proir resume 14 days after. Zepbound hold 7 days prior to surgery    Type of Anesthesia:  General    Additional requests/questions:  Can take aspirin  325 mg in place of plavix  if needed   Aaron Reese   07/01/2024, 8:12 AM   "

## 2024-07-01 NOTE — Telephone Encounter (Signed)
 I s/w the pt and reviewed DR. Munley's schedules, though no available open appt's. I informed the pt that I will ask the scheduling team for Dr. Monetta to call him with an appt. Pt thanked me for the help.

## 2024-07-01 NOTE — Telephone Encounter (Signed)
 Pt has appt 07/15/24 Dr. Monetta for preop clearance.

## 2024-07-14 NOTE — Progress Notes (Unsigned)
 " Cardiology Office Note:    Date:  07/15/2024   ID:  Aaron Reese, DOB 07-Nov-1965, MRN [id number]  PCP:  Aaron Arlyne BRAVO, FNP  Cardiologist:  Aaron Leiter, MD    Referring MD: Aaron Arlyne BRAVO, FNP    ASSESSMENT:    1. Pre-op evaluation   2. Benign essential hypertension   3. Coronary artery disease of native artery of native heart with stable angina pectoris   4. Hypercholesterolemia    PLAN:    In order of problems listed above:  From cardiology perspective he is optimized for his planned intermediate risk elective spine surgery He tells me that he will need to withdraw clopidogrel  a week prior 2 weeks after surgery and I think that is not a problem If admitted to the hospital doing EKG postoperative day 1 I prefer to see him in a monitored bed the first 24 hours EKG postoperative day 1 normal today And if any cardiology problems contact heart care Stable CAD no anginal discomfort Hypertension is at target continue beta-blocker ACE inhibitor Continue his combined lipid-lowering treatment through surgery and postoperatively   Next appointment: I will plan to see Aaron Reese in the office 1 year   Medication Adjustments/Labs and Tests Ordered: Current medicines are reviewed at length with the patient today.  Concerns regarding medicines are outlined above.  Orders Placed This Encounter  Procedures   EKG 12-Lead   No orders of the defined types were placed in this encounter.    History of Present Illness:    Aaron Reese is a 60 y.o. male with a hx of CAD with PCI and drug-eluting stents to left anterior descending coronary artery hypertension and hyperlipidemia last seen 07/15/2023. Compliance with diet, lifestyle and medications: Yes  From a cardiology perspective Aaron is doing very well no cardiovascular symptoms of exercise intolerance chest pain shortness of breath edema palpitation or syncope He continues on long-term clopidogrel  therapy combined statin  plus Zetia managed by Aaron Reese nurse practitioner city of [place] he tells me his LDLs are optimal. He has upcoming Reese surgery in March I see no reason he cannot have his clopidogrel  withdrawn pre and postoperatively and in my opinion he is optimized require any further cardiac evaluation Past Medical History:  Diagnosis Date   Abnormal finding on lung imaging 03/31/2018   October 2019 CT chest  showing normal pulmonary parenchyma in the upper lobes but in the bases there is atelectasis and some airway thickening with surrounding groundglass.  Compared to in April 2018 CT scan of his abdomen this does appear to be progressive.  10/07/2018-chest x-ray ([facility])- chronic appearing lingular opacity, no acute infiltrate edema mass or effusion otherwise, chr   Anal fissure 05/02/2022   Anxiety 03/24/2018   Benign essential hypertension 03/24/2018   CAD (coronary artery disease)    s/p DES to pLAD & mLAD 04/08/18   Chest pain in adult 03/24/2018   Chlorine gas exposure 10/08/2018   Depression 03/24/2018   Frequent headaches 03/24/2018   GAD (generalized anxiety disorder) 04/05/2018   GERD (gastroesophageal reflux disease) 03/24/2018   Hypercholesterolemia 03/24/2018   Hypotestosteronemia 03/24/2018   OSA (obstructive sleep apnea) 10/08/2018   Split-night sleep study January 2020 AHI 24.3 with optimal control on CPAP 12 cm H2O.    Shortness of breath 10/08/2018   10/08/2018-CT chest without contrast- lingular scarring, no confluent opacities or effusions otherwise  12/10/2018-pulmonary function test- FVC 4.15 (79% predicted), postbronchodilator ratio 84, postbronchodilator FEV1 3.72 (91% predicted), no  bronchodilator response, mid flow reversibility, DLCO 94   Sleep apnea    Substernal chest pain 03/24/2018    Current Medications: Active Medications[1]    EKGs/Labs/Other Studies Reviewed:    The following studies were reviewed today:  EKG  Interpretation Date/Time:  Wednesday July 15 2024 12:48:12 EST Ventricular Rate:  70 PR Interval:  180 QRS Duration:  98 QT Interval:  374 QTC Calculation: 403 R Axis:   36  Text Interpretation: Normal sinus rhythm Normal ECG When compared with ECG of 15-Jul-2023 15:17, No significant change was found Confirmed by Monetta Rogue ([id number]) on 07/15/2024 12:51:13 PM     Physical Exam:    VS:  BP 120/72   Pulse 70   Ht 6' (1.829 m)   Wt 224 lb 12.8 oz (102 kg)   SpO2 98%   BMI 30.49 kg/m     Wt Readings from Last 3 Encounters:  07/15/24 224 lb 12.8 oz (102 kg)  06/30/24 220 lb 6 oz (100 kg)  06/03/24 226 lb (102.5 kg)     GEN:  Well nourished, well developed in no acute distress HEENT: Normal NECK: No JVD; No carotid bruits LYMPHATICS: No lymphadenopathy CARDIAC: RRR, no murmurs, rubs, gallops RESPIRATORY:  Clear to auscultation without rales, wheezing or rhonchi  ABDOMEN: Soft, non-tender, non-distended MUSCULOSKELETAL:  No edema; No deformity  SKIN: Warm and dry NEUROLOGIC:  Alert and oriented x 3 PSYCHIATRIC:  Normal affect    Signed, Rogue Monetta, MD  07/15/2024 1:03 PM    [facility]      [1]  Current Meds  Medication Sig   clopidogrel  (PLAVIX ) 75 MG tablet TAKE ONE TABLET BY MOUTH DAILY WITH BREAKFAST   docusate sodium (COLACE) 100 MG capsule Take 100 mg by mouth daily.   escitalopram (LEXAPRO) 10 MG tablet Take 10 mg by mouth daily.   ezetimibe (ZETIA) 10 MG tablet Take 10 mg by mouth daily. Monday / Wednesday / Friday   famotidine  (PEPCID ) 40 MG tablet Take 40 mg by mouth at bedtime.   lisinopril  (PRINIVIL ,ZESTRIL ) 10 MG tablet Take 10 mg by mouth every evening.    metoprolol  succinate (TOPROL -XL) 50 MG 24 hr tablet Take 1 tablet (50 mg total) by mouth daily. Take with or immediately following a meal.   nitroGLYCERIN  (NITROSTAT ) 0.4 MG SL tablet Place 1 tablet (0.4 mg total) under the tongue every 5 (five) minutes as needed.    rosuvastatin (CRESTOR) 40 MG tablet Take 40 mg by mouth daily.   traMADol (ULTRAM) 50 MG tablet Take 150 mg by mouth as needed for pain.   ZEPBOUND 7.5 MG/0.5ML Pen Inject 7.5 mg into the skin once a week.   Current Facility-Administered Medications for the 07/15/24 encounter (Office Visit) with Monetta Rogue PARAS, MD  Medication   0.9 %  sodium chloride  infusion   "

## 2024-07-15 ENCOUNTER — Encounter: Payer: Self-pay | Admitting: Cardiology

## 2024-07-15 ENCOUNTER — Ambulatory Visit: Attending: Cardiology | Admitting: Cardiology

## 2024-07-15 VITALS — BP 120/72 | HR 70 | Ht 72.0 in | Wt 224.8 lb

## 2024-07-15 DIAGNOSIS — Z01818 Encounter for other preprocedural examination: Secondary | ICD-10-CM

## 2024-07-15 DIAGNOSIS — I10 Essential (primary) hypertension: Secondary | ICD-10-CM | POA: Diagnosis not present

## 2024-07-15 DIAGNOSIS — I25118 Atherosclerotic heart disease of native coronary artery with other forms of angina pectoris: Secondary | ICD-10-CM | POA: Diagnosis not present

## 2024-07-15 DIAGNOSIS — E7800 Pure hypercholesterolemia, unspecified: Secondary | ICD-10-CM

## 2024-07-15 NOTE — Patient Instructions (Signed)

## 2024-09-02 ENCOUNTER — Ambulatory Visit: Admit: 2024-09-02 | Admitting: Neurosurgery

## 2024-09-16 ENCOUNTER — Encounter: Admitting: Orthopedic Surgery

## 2024-10-13 ENCOUNTER — Other Ambulatory Visit

## 2024-10-13 ENCOUNTER — Encounter: Admitting: Neurosurgery

## 2024-11-19 ENCOUNTER — Other Ambulatory Visit

## 2024-11-19 ENCOUNTER — Encounter: Admitting: Orthopedic Surgery
